# Patient Record
Sex: Male | Born: 1955 | Race: White | Hispanic: No | Marital: Single | Smoking: Former smoker
Health system: Southern US, Community
[De-identification: ages and names within clinical notes are randomized; demographics above are authoritative.]

## PROBLEM LIST (undated history)

## (undated) DIAGNOSIS — E119 Type 2 diabetes mellitus without complications: Secondary | ICD-10-CM

## (undated) DIAGNOSIS — I10 Essential (primary) hypertension: Secondary | ICD-10-CM

## (undated) DIAGNOSIS — I219 Acute myocardial infarction, unspecified: Secondary | ICD-10-CM

## (undated) HISTORY — PX: CORONARY STENT PLACEMENT: SHX1402

---

## 2006-10-10 ENCOUNTER — Inpatient Hospital Stay (HOSPITAL_COMMUNITY): Admission: EM | Admit: 2006-10-10 | Discharge: 2006-10-12 | Payer: Self-pay | Admitting: Internal Medicine

## 2006-10-10 ENCOUNTER — Ambulatory Visit: Payer: Self-pay | Admitting: Internal Medicine

## 2006-10-10 ENCOUNTER — Encounter: Payer: Self-pay | Admitting: Emergency Medicine

## 2006-10-28 ENCOUNTER — Ambulatory Visit: Payer: Self-pay | Admitting: Internal Medicine

## 2006-12-03 ENCOUNTER — Ambulatory Visit: Payer: Self-pay | Admitting: Internal Medicine

## 2008-07-21 ENCOUNTER — Emergency Department (HOSPITAL_COMMUNITY): Admission: EM | Admit: 2008-07-21 | Discharge: 2008-07-21 | Payer: Self-pay | Admitting: Emergency Medicine

## 2010-12-26 NOTE — Assessment & Plan Note (Signed)
[facility]                            CARDIOLOGY OFFICE NOTE   NAME:Herbert Compton, [name]                       MRN:          [id number]  DATE:12/06/2006                            DOB:          12-14-1955    IDENTIFICATION:  Patient is a gentleman who I follow in the clinic.  He  had an MI with bare metal stent to the RCA back in March.  When I saw  the patient on Friday, he still had a rash.  He had tried the Zyrtec,  did not tolerate it.  I told him to stop the simvastatin, to see if that  was causing a reaction.  He did that this weekend.  There was no change.   I told him to resume the simvastatin (he had been on Lipitor prior but  still with a rash).  He has a prescription to begin Ticlid b.i.d.  He  will be scheduled for bimonthly blood counts.  He will call at the end  of the week regarding his rash response.     Pricilla Riffle, MD, [facility]  Electronically Signed    PVR/MedQ  DD: 12/06/2006  DT: 12/07/2006  Job #: [id number]

## 2010-12-26 NOTE — Cardiovascular Report (Signed)
NAME:  CAMBRIDGE, DELEO NO.:  [id number]   MEDICAL RECORD NO.:  [id number]          PATIENT TYPE:  INP   LOCATION:  2920                         FACILITY:  [facility]   PHYSICIAN:  Everardo Beals. Juanda Chance, MD, FACCDATE OF BIRTH:  Dec 27, 1955   DATE OF PROCEDURE:  10/10/2006  DATE OF DISCHARGE:                            CARDIAC CATHETERIZATION   HISTORY:  Mr. Riches is 55 years old and works as a Psychologist, occupational.  He has no  prior history of known heart disease.  He is a former smoker.  At 6:30  a.m. he had developed chest pain and went to [facility] emergency room  where his ECG showed an acute diaphragmatic wall infarction and code  STEMI was called.  He was transferred by [facility] to the catheterization  laboratory.  He received heparin, aspirin and clopidogrel at [facility]  or en route.   PROCEDURE:  The procedure was performed of the femoral artery using an  arterial sheath and 6-French preformed coronary catheters.  A front wall  arterial puncture was performed and Omnipaque contrast was used.  After  completion of the diagnostic study, I made the decision to do  intervention on the totally occluded right coronary artery.   The patient given antiemetics bolus and infusion.  He had already been  given aspirin and 600 mg Plavix.  We used a 6-French JR-4 guiding  catheter with side holes and we crossed the totally obstructed artery  with a Prowater wire without difficulty.  We predilated with a 2.5 x 20-  mm Maverick balloon performing one inflation up to 8 atmospheres for 30  seconds.  This resulted in profound bradycardia requiring treatment with  Atropine, dopamine and IV fluids and repeated coughing.  We placed a  venous sheath but by the time we ready to pass a pacemaker, his blood  pressure and pulse rate had normalized.  We then stented the lesion with  a 3.5 x 12 mm Liberte stent, deploying this with one inflation of 16  atmospheres for 20 seconds.  We post dilated with a  4.0 x 8-mm Quantum  Maverick, performing two inflations at 16 atmospheres for 20 seconds.  Final diagnostics were then performed through the guiding catheter.  Right femoral artery was closed Angio-Seal anesthesia.  The patient  tolerated the procedure well and left the laboratory in satisfactory  condition.   RESULTS:  The aortic pressure was 115/76 with mean of 93 and left  ventricle pressure was 115/17.   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   The left anterior descending artery gave rise to a large diagonal branch  and two septal perforators.  There was 4% narrowing just in the proximal  LAD just after the first large diagonal branch.   The circumflex artery rose anomalously from the right coronary artery.  This gave rise to a marginal branch and two posterolateral branches  which appear to be free of significant disease.   The right coronary artery was a moderate-sized vessel which gave rise to  a  conus branch of right ventricular branch supplied part of the inferior  septum, a post descending branch and posterolateral branch.  The artery  was completely occluded at its mid portion before the right ventricular  branch.  There was a 40% narrowing proximally.   The left ventricle on RAO projection showed hypokinesis of the  inferobasal segment.  The rest of the wall motion was quite good.  The  ejection fraction was estimated at 60%.   Following stenting of the lesion in the right coronary artery,  the  stenosis improved from 100% to 0% and the flow improved from TIMI 0 to  TIMI 3 flow.   The patient had the onset of chest pain at 6:30, arrived at [facility]  emergency department 8:35.  Code STEMI was called at 8:57.  The patient  arrived at the [facility] cath lab via [facility] at 945.  First balloon  inflation was at 10:15.  This gave a door of balloon time for [facility]  of 100 minutes and refusion time  of 3 hours 45 minutes.   CONCLUSION:  1. 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  No significant obstruction in the circumflex artery      which arose anomalously from the right coronary artery and      inferobasal wall hypokinesis with an estimated fraction 60%.  2. Successful reperfusion and stenting of the lesion in the mid-right      coronary artery using a bare metal stent with improvement of the      narrowing from 100% to 0%,  improvement of flow from TIMI 0 to TIMI      3 flow.   DISPOSITION:  The patient returned to the coronary care unit for further  observation.  He will be classified as low risk and will target  discharge on day #2.      Bruce Elvera Lennox Juanda Chance, MD, [facility]  Electronically Signed     BRB/MEDQ  D:  10/10/2006  T:  10/10/2006  Job:  [id number]   cc:   Pricilla Riffle, MD, [facility]  Cardiopulmonary Lab

## 2010-12-26 NOTE — Assessment & Plan Note (Signed)
[facility]                       [place] CARDIOLOGY OFFICE NOTE   NAME:Herbert Compton, Herbert Compton                       MRN:          [id number]  DATE:10/28/2006                            DOB:          01/10/1956    IDENTIFICATION:  [name] is a 55 year old gentleman who was recently  discharged (March 4th) from [facility] after he suffered an inferior  wall myocardial infarction and underwent PTCA stent (bare metal) to the  RCA which it was 100% occluded. LVF was 60% with inferobasilar akinesis.   Since discharge, he has been doing well, although he says he feels  somewhat sluggish. He gradually seems to be getting some energy back. He  does note a rash, which he has noted in the hospital but denies any  pruritic symptoms. No shortness of breath or wheezing.   CURRENT MEDICATIONS:  1. Lipitor 80.  2. Aspirin 325.  3. Multivitamin daily.  4. Metoprolol XL 50 daily.  5. Plavix 75 daily.   PHYSICAL EXAMINATION:  The patient is in no acute distress. Blood  pressure is 130/80. Pulse is 72 and regular. Weight is 225 pounds.  HEENT: Normocephalic, atraumatic. EOMI. Throat clear.  NECK:  JVP is normal.  No bruits.  LUNGS:  Clear to auscultation. No wheezes or rales.  CARDIAC: Regular rate and rhythm, S1, S2. No S3. No  murmurs.  ABDOMEN: Benign.  EXTREMITIES: Good pulses. No edema. Right groin without any bruit.  SKIN: Mild erythema throughout. Question some maculopapular changes, but  very mild.   IMPRESSION:  1. Coronary artery disease, clinically doing well. I would like to      keep him on his beta-blocker therapy and I would not pull back for      now. Encouraged him to try to increase activity. Will switch him to      b.i.d. formulation for cough conservation. Will discuss with Charlies Constable how long he should be on the Plavix. He had been told a      year, but with the rash will need to follow. Would take Zyrtec as      this rash most  likely is related to Plavix.  2. Dyslipidemia. Switch him to Zocor 80 given cost constraints.  3. Rash as noted above.   Encouraged the patient to increase his activity. He is not sure, but he  will try if covered to go to cardiac rehab. I told him it is okay to  slowly increase his activities at work. I will set to see the patient  back in one months time.     Pricilla Riffle, MD, [facility]  Electronically Signed    PVR/MedQ  DD: 10/28/2006  DT: 10/28/2006  Job #: [id number]

## 2010-12-26 NOTE — H&P (Signed)
NAME:  SAYED, APOSTOL NO.:  [id number]   MEDICAL RECORD NO.:  [id number]          PATIENT TYPE:  INP   LOCATION:  2920                         FACILITY:  [facility]   PHYSICIAN:  Pricilla Riffle, MD, FACCDATE OF BIRTH:  11/21/1955   DATE OF ADMISSION:  10/10/2006  DATE OF DISCHARGE:                              HISTORY & PHYSICAL   INDICATIONS:  Mr. Crescenzo is a 55 year old male with no prior history of  coronary artery disease.  He got over the flu a couple of weeks ago and  has been short of breath since.  He said it gets worse and also  developed some chest pressure with exertion.   This morning he woke up and again noticed some tightness in his chest  and presented to the emergency room for further evaluation.  Patient  noted the chest pain on arrival was 2/10.  He was slightly diaphoretic  and clammy, nauseated.   EKG was done that showed ST elevation inferiorly.  Patient was given  nitroglycerin x3, aspirin, heparin, Lopressor and transfer to [facility].  Note:  Plavix 600 given.   ALLERGIES:  NEOSPORIN.   PAST MEDICAL HISTORY:  1. DJD, back.  2. Jehovah's Witness.   SOCIAL HISTORY:  The patient does not smoke, does not drink.   FAMILY HISTORY:  Father has a history of coronary artery disease, and is  deceased, also history of CVA.  Mother is alive at age 29.   REVIEW OF SYSTEMS:  All systems are reviewed.  Negative to the above  problem except as noted.   PHYSICAL EXAM:  GENERAL:  On exam the patient is in no distress, denies  pain actually.  VITAL SIGNS:  Blood pressure 150 systolic.  Pulse is 65.  Respiratory  rate 16.  HEENT:  Normocephalic, atraumatic.  EOMI, PERRL.  Throat clear.  NECK:  JVP is normal.  LUNGS:  Clear to auscultation.  No rales.  No wheezes.  CARDIAC EXAM:  Regular rate and rhythm.  Very distant heart sounds.  No  extra heart sounds noted.  Normal S1, S2.  No significant murmurs.  ABDOMEN:  Is benign.  Obese.  EXTREMITIES:  2+ dorsalis pedis pulses.  No lower extremity edema.   LABS:  Show a hemoglobin of 14.9.   12-lead EKG in sinus rhythm at 64 beats/minute, inferior ST elevation  with slight ST depression V2, T-wave inversion V4-V6.   IMPRESSION:  Patient is a 55 year old gentleman, no known history of  coronary artery disease, does not follow up with a doctor much, his  father who had coronary disease, now with changes consistent with  inferior wall myocardial infarction.  He is currently pain-free.   PLAN:  Plan for cardiac catheterization.  Risks and benefits described.  Patient understands and agrees to proceed.      Pricilla Riffle, MD, [facility]  Electronically Signed     PVR/MEDQ  D:  10/10/2006  T:  10/10/2006  Job:  [id number]

## 2010-12-26 NOTE — Discharge Summary (Signed)
NAME:  Herbert Compton, Herbert Compton NO.:  [id number]   MEDICAL RECORD NO.:  [id number]          PATIENT TYPE:  INP   LOCATION:  4742                         FACILITY:  [facility]   PHYSICIAN:  Everardo Beals. Juanda Chance, MD, FACCDATE OF BIRTH:  1956/07/27   DATE OF ADMISSION:  10/10/2006  DATE OF DISCHARGE:  10/12/2006                               DISCHARGE SUMMARY   PRIMARY CARDIOLOGIST:  Pricilla Riffle, MD, [facility].   PRINCIPAL DIAGNOSIS:  Acute inferior ST-segment elevation myocardial  infarction.   SECONDARY DIAGNOSES:  1. Coronary artery disease.  2. Hyperlipidemia.  3. 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  4. Family history of coronary artery disease.  5. Denied accepting blood products.   ALLERGIES:  NEOSPORIN.   PROCEDURES:  1. Left heart cardiac catheterization with successful PCI and stenting      of the distal right coronary artery with placement of a 3.5 x 12 mm      Liberte bare metal stent.   HISTORY OF PRESENT ILLNESS:  A 55 year old male with no prior history of  coronary artery disease who was in his usual state of health until  approximately two weeks prior to admission when he began to experience  exertional chest discomfort with dyspnea.  On the morning of admission,  he awoke at 6:30 a.m. with chest pain and presented to [facility] where ECG revealed ST segment elevation in the inferior leads.  The patient was transferred to [facility] for further management of  acute inferior ST elevation MI.   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  The RCA was successfully stented with a 3.5 x 12 mm  Liberte bare metal stent.  Notably, an EF of 60% with inferobasilar  akinesis.   The patient was followed in the  ICU for 24 hours and was transferred to  the floor on March 3.  He has had no recurrent chest discomfort and has  been ambulating without difficulty.  He is being discharged home today  in satisfactory condition.   DISCHARGE LABORATORY DATA:  Hemoglobin 12.9, hematocrit 37.1, WBC 9.7,  platelets 255, MCV 94.2, sodium 138, potassium 4.2, chloride 104, CO2  27, BUN 10, creatinine 0.95, glucose 99.  PT 12.5, INR is 0.9.  Peak CK  was 538, MB of 101.3, and troponin-I of 18.75.  Total cholesterol 198,  triglycerides 422, HDL 28, LDL not calculated, calcium 9.0, hemoglobin  A1c 6.5.   DISPOSITION:  The patient is being discharged home  today in good  condition.   FOLLOWUP PLANS AND APPOINTMENTS:  The patient has followup with Dr.  Dietrich Pates on October 28, 2006 at 2:30 p.m.  He is also to follow up with  primary care Ahnna Dungan in approximately one to two weeks.   DISCHARGE MEDICATIONS:  1. Aspirin 325 mg daily.  2. Plavix 75 mg daily.  3. Lipitor 80 mg q.p.m.  4. __________ 50 mg daily.  5. Nitroglycerin 0.4 mg sublingual p.r.n. chest pain.   The patient has been counseled for weight loss and a low glycemic diet  as he does have an elevated hemoglobin A1c.  It is recommended he follow  up with his primary care Janele Lague with regards to impaired glucose  tolerance.   OUTSTANDING LABORATORY STUDIES:  None.   DURATION OF DISCHARGE ENCOUNTER:  Thirty-five minutes including  physician time.      Nicolasa Ducking, ANP      Bruce R. Juanda Chance, MD, [facility]  Electronically Signed    CB/MEDQ  D:  10/12/2006  T:  10/12/2006  Job:  [id number]   cc:   Pricilla Riffle, MD, [facility]

## 2010-12-26 NOTE — Assessment & Plan Note (Signed)
[facility]                       [place] CARDIOLOGY OFFICE NOTE   NAME:Herbert Compton, Herbert Compton                       MRN:          [id number]  DATE:12/03/2006                            DOB:          Dec 07, 1955    IDENTIFICATION:  Mr. Friesen is a 55 year old gentleman, discharged from  [facility] back in early March, after suffering an inferior wall MI.  Underwent bare metal stent placement to the RCA that was occluded prior.  I last saw him on March 20.   When I saw him last, he had a rash.  I recommended taking Zyrtec.  Felt  the rash most likely was from the Plavix.   The patient currently denies chest pain, no shortness of breath.  He has  felt some sluggish, though, since being on the medicines.  Rash persists  again and he did not tolerate Zyrtec, said he felt bad.   CURRENT MEDICATIONS INCLUDE:  1. Aspirin 325 daily.  2. Multivitamin.  3. Plavix 75 daily.  4. Simvastatin 80 daily.   PHYSICAL EXAM:  Patient is in no distress.  Blood pressure 112/80, pulse  of 68 and regular, weight 223.  LUNGS:  Clear.  CARDIAC EXAM:  Regular rate and rhythm, S1, S2, no S3, no murmurs.  ABDOMEN:  Benign.  EXTREMITIES:  No edema.  SKIN:  Mild maculopapular diffuse rash.   IMPRESSION:  1. CAD.  Doing well, except for the rash.  I have discussed the case      with Charlies Constable.  He would like to see 6-12 months of dual      antiplatelet therapy and recommends switching the patient to      Ticlid.  Prior to doing this, I will try discontinuation of Zocor      and, if his rash improves, keep on the Plavix.  Otherwise, resume      Zocor and discontinue Plavix, switching over to Ticlid.  He will      need to have blood counts drawn every two weeks and he is aware of      this.  2. Dyslipidemia.  On Zocor for now with short trial discontinuation.  3. Rash, as above.  4. Fatigability.  We will back down on his Lopressor a bit.  Again, I      do not think the  rash is from this.  Back down to 12.5 b.i.d. to      see if his energy level improves.   I will set followup for August, sooner if problems develop.   ADDENDUM:  Patient can cut back on aspirin to 81 mg daily.     Pricilla Riffle, MD, [facility]  Electronically Signed    PVR/MedQ  DD: 12/03/2006  DT: 12/03/2006  Job #: [id number]

## 2011-05-15 LAB — URINALYSIS, ROUTINE W REFLEX MICROSCOPIC
Glucose, UA: NEGATIVE mg/dL
Ketones, ur: NEGATIVE mg/dL
Urobilinogen, UA: 0.2 mg/dL (ref 0.0–1.0)
pH: 7.5 (ref 5.0–8.0)

## 2012-03-24 ENCOUNTER — Ambulatory Visit (HOSPITAL_COMMUNITY)
Admission: RE | Admit: 2012-03-24 | Discharge: 2012-03-24 | Disposition: A | Discharge status: Home / Self Care | Payer: Non-veteran care | Source: Ambulatory Visit | Attending: Pathology | Admitting: Pathology

## 2012-03-24 DIAGNOSIS — M79609 Pain in unspecified limb: Secondary | ICD-10-CM | POA: Insufficient documentation

## 2012-03-24 DIAGNOSIS — M545 Low back pain, unspecified: Secondary | ICD-10-CM | POA: Insufficient documentation

## 2012-03-24 DIAGNOSIS — IMO0001 Reserved for inherently not codable concepts without codable children: Secondary | ICD-10-CM | POA: Insufficient documentation

## 2012-03-24 NOTE — Evaluation (Signed)
Physical Therapy Evaluation  Patient Details  Name: Herbert Compton MRN: [id number] Date of Birth: 1956/01/22  Today's Date: 03/24/2012 Time: 1435-1520 PT Time Calculation (min): 45 min  Visit#: 1  of 8   Re-eval: 04/23/12 Assessment Diagnosis:  (LBP) Prior Therapy: N/A  Authorization: [facility]    Authorization Time Period: 02/23/2012-05/09/2012  Authorization Visit#: 1  of 8    Past Medical History: No past medical history on file. Past Surgical History: No past surgical history on file.  Subjective Symptoms/Limitations Symptoms: Pt states that his pain has been primarily on his right side until about six months ago when it began to go down his left leg to his knee level.  The patient states that he has pain going down his leg multiple times a day.   How long can you sit comfortably?: The patient states that he is able to sit for fifteen to thirty minutes before he has to get up. How long can you stand comfortably?: The patient is able to stand easier than he can sit.  Able to stand for 20-30 minutes. How long can you walk comfortably?: The patient states that as long as he is walking slow he could walk for thirty minutes straight. Special Tests: The patient states that he is waking up at least once a night. Pain Assessment Currently in Pain?: Yes Pain Score:   2 (worst pain in the past week 10/10; best 2/10) Pain Location: Back Pain Orientation: Left;Right Pain Type: Chronic pain Pain Onset: More than a month ago Pain Frequency: Constant (but pain varies.) Pain Relieving Factors: ICE/ TENS  Effect of Pain on Daily Activities: better in the morning  worse as the day goes on.   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) Leisure: Hobbies-yes (Comment) Comments: fish    Sensation/Coordination/Flexibility/Functional Tests Functional  Tests Functional Tests: PSLR R55; L 40  Assessment RLE Strength Right Hip Flexion: 5/5 Right Hip Extension: 4/5 Right Hip ABduction: 5/5 Right Knee Flexion: 4/5 Right Knee Extension: 5/5 Right Ankle Dorsiflexion: 5/5 LLE Strength Left Hip Flexion: 5/5 Left Hip Extension: 4/5 Left Hip ABduction: 5/5 Left Knee Flexion: 4/5 Left Knee Extension: 5/5 Left Ankle Dorsiflexion: 5/5 Lumbar AROM Lumbar Flexion: decreased 25% pain upon return Lumbar Extension: wnl- reps no change Lumbar - Right Side Bend: wnl-reps causing no change. Lumbar - Left Side Bend: wnl-reps no change Lumbar - Right Rotation: decreased 15% Lumbar - Left Rotation: wnl  Exercise/Treatments Mobility/Balance  Posture/Postural Control Posture/Postural Control: Postural limitations Postural Limitations: protruding abs;    Stretches Active Hamstring Stretch: 3 reps;30 seconds Single Knee to Chest Stretch: 3 reps;30 seconds   Supine Ab Set: 5 reps Other Supine Lumbar Exercises: Kegal 5     Physical Therapy Assessment and Plan PT Assessment and Plan Clinical Impression Statement: pt with tight hamstring/piriformis mm and weak core who will benefit from skilled PT to decrease back pain. Pt will benefit from skilled therapeutic intervention in order to improve on the following deficits: Pain Rehab Potential: Good PT Frequency: Min 2X/week PT Duration: 4 weeks PT Treatment/Interventions: Therapeutic exercise PT Plan: work on core stability exercisesl    Goals Home Exercise Program Pt will Perform Home Exercise Program: Independently PT Short Term Goals Time to Complete Short Term Goals: 2 weeks PT Short Term Goal 1: Pt to state pain is not over a 7/10 PT Short Term Goal 2: Pt  able to sit for 45 min with comfort. PT Short Term Goal 3: Pt able to walk for 45 minutes with comfort PT Long Term Goals Time to Complete Long Term Goals: 4 weeks PT Long Term Goal 1: Pt to state no more radicular pain PT Long  Term Goal 2: Pt to state pain is no greater than a 5/10  80% of the day Long Term Goal 3: Pt to be able to walk for an hour without pain Long Term Goal 4: Pt to be able to sit for an hour without pain.  Problem List There is no problem list on file for this patient.   PT - End of Session Activity Tolerance: Patient tolerated treatment well General Behavior During Session: [facility] for tasks performed Cognition: [facility] for tasks performed PT Plan of Care PT Home Exercise Plan: given Consulted and Agree with Plan of Care: Patient     Bricyn Labrada,CINDY 03/24/2012, 3:27 PM  Physician Documentation Your signature is required to indicate approval of the treatment plan as stated above.  Please sign and either send electronically or make a copy of this report for your files and return this physician signed original.   Please mark one 1.__approve of plan  2. ___approve of plan with the following conditions.   ______________________________                                                          _____________________ Physician Signature                                                                                                             Date

## 2012-03-29 ENCOUNTER — Ambulatory Visit (HOSPITAL_COMMUNITY)
Admission: RE | Admit: 2012-03-29 | Discharge: 2012-03-29 | Disposition: A | Discharge status: Home / Self Care | Payer: Non-veteran care | Source: Ambulatory Visit | Attending: Pathology | Admitting: Pathology

## 2012-03-29 NOTE — Progress Notes (Signed)
Physical Therapy Treatment Patient Details  Name: Herbert Compton MRN: [id number] Date of Birth: [birth year]  Today's Date: 03/29/2012 Time: 6962-9528 PT Time Calculation (min): 45 min  Visit#: 2  of 8   Re-eval: 04/23/12 Charges: Therex x 32' Manual x 8'   Authorization: [facility]    Authorization Time Period: 02/23/2012-05/09/2012  Authorization Visit#: 2  of 8    Subjective: Symptoms/Limitations Symptoms: Pt reprots HEP compliance. Pain Assessment Currently in Pain?: Yes Pain Score:   2 Pain Location: Back Pain Orientation: Lower Pain Radiating Towards: R lateral knee (4/10) burning sensation   Exercise/Treatments Stretches Active Hamstring Stretch: 3 reps;30 seconds Single Knee to Chest Stretch: 3 reps;30 seconds Supine Ab Set: 10 reps;5 seconds Bent Knee Raise: 10 reps Bridge: 10 reps;3 seconds Other Supine Lumbar Exercises: Pelvic floor contraction 10x5" Prone  Straight Leg Raise: 10 reps Other Prone Lumbar Exercises: POE x 4'  Manual Therapy Manual Therapy: Other (comment) Other Manual Therapy: R sciatic nerve glides to decrese radicular sx  Physical Therapy Assessment and Plan PT Assessment and Plan Clinical Impression Statement: Pt completes stabilization exercises with good core control after demo and cueing for technique. Pt appears to find relief from radicular sx with trunk ext. Began sciatic nerve glides on R LE. Pt reports radicular sx decrease from 4/10 to 1/10. No change in LBP (2/10). PT Plan: Continue to progress per PT POC.     Problem List There is no problem list on file for this patient.   PT - End of Session Activity Tolerance: Patient tolerated treatment well General Behavior During Session: [facility] for tasks performed Cognition: [facility] for tasks performed  Seth Bake, PTA 03/29/2012, 4:05 PM

## 2012-03-31 ENCOUNTER — Ambulatory Visit (HOSPITAL_COMMUNITY)
Admission: RE | Admit: 2012-03-31 | Discharge: 2012-03-31 | Disposition: A | Discharge status: Home / Self Care | Payer: Non-veteran care | Source: Ambulatory Visit | Attending: Pathology | Admitting: Pathology

## 2012-03-31 NOTE — Progress Notes (Signed)
Physical Therapy Treatment Patient Details  Name: Herbert Compton MRN: [id number] Date of Birth: 1956/06/28  Today's Date: 03/31/2012 Time: 1418-1500 PT Time Calculation (min): 42 min  Visit#: 3  of 8   Re-eval: 04/23/12 Charges: Therex x 30' Manual x 8'  Authorization: [facility]    Authorization Time Period: 02/23/2012-05/09/2012  Authorization Visit#: 3  of 8    Subjective: Symptoms/Limitations Symptoms: Pt reports continues radicular sx in R leg. L LE had some sx that subsided earlier in the day. Pain Assessment Currently in Pain?: Yes Pain Score:   2 Pain Location: Back Pain Orientation: Right;Lower Pain Radiating Towards: R lateral ankle   Exercise/Treatments Supine Ab Set: 10 reps;5 seconds Bent Knee Raise: 10 reps Bridge: 15 reps Other Supine Lumbar Exercises: Pelvic floor contraction 10x5" Prone  Single Arm Raise: 10 reps Straight Leg Raise: 10 reps Other Prone Lumbar Exercises: POE x 4'  Manual Therapy Manual Therapy: Other (comment) Other Manual Therapy:  R sciatic nerve glides to decrese radicular sx  Physical Therapy Assessment and Plan PT Assessment and Plan Clinical Impression Statement: Pt continues to do well with stabilization exercises. Pt also continues to find relief from radicular sx with trunk extension and nerve glides. Pt reports radicular sx decrease to 1/10 at end of session. LBP increase to 3/10 secondary to centralization of pain. PT Plan: Continue to progress per PT POC.     Problem List There is no problem list on file for this patient.   PT - End of Session Activity Tolerance: Patient tolerated treatment well General Behavior During Session: [facility] for tasks performed Cognition: [facility] for tasks performed  Seth Bake, PTA 03/31/2012, 3:04 PM

## 2012-04-05 ENCOUNTER — Ambulatory Visit (HOSPITAL_COMMUNITY)
Admission: RE | Admit: 2012-04-05 | Discharge: 2012-04-05 | Disposition: A | Discharge status: Home / Self Care | Payer: Non-veteran care | Source: Ambulatory Visit | Attending: Pathology | Admitting: Pathology

## 2012-04-05 NOTE — Progress Notes (Addendum)
Physical Therapy Treatment Patient Details  Name: Herbert Compton MRN: [id number] Date of Birth: 03/20/1956  Today's Date: 04/05/2012 Time: 7829-5621 PT Time Calculation (min): 42 min  Visit#: 4  of 8   Re-eval: 04/23/12 Charges: Therex x 30' Manual x 8'  Authorization: [facility]    Authorization Time Period: 02/23/2012-05/09/2012  Authorization Visit#: 4  of 8    Subjective: Symptoms/Limitations Symptoms: Pt states that radicualr sx have decreased.  Pain Assessment Currently in Pain?: Yes Pain Score:   2 Pain Location: Back Pain Orientation: Right;Lower Pain Radiating Towards: R lateral ankle   Exercise/Treatments Supine Ab Set: 10 reps;5 seconds (HEP) Bent Knee Raise: 10 reps Bridge: 15 reps Large Ball Abdominal Isometric: 5 reps;5 seconds Large Ball Oblique Isometric: 5 reps;5 seconds Other Supine Lumbar Exercises: Pelvic floor contraction 10x5" (HEP) Prone  Single Arm Raise: 10 reps Straight Leg Raise: 10 reps Other Prone Lumbar Exercises: POE x 3'  Manual Therapy Manual Therapy: Other (comment) Other Manual Therapy: R sciatic nerve glides to decrese radicular sx   Physical Therapy Assessment and Plan PT Assessment and Plan Clinical Impression Statement: Progressed stabilization exercises with minimal difficulty. Nerve glides and trunk extension continue to reduce radicular sx. Pt reports continued HEP compliance. At end of session pt reports radicular pain decrease to 1/10.  PT Plan: Continue to progress per PT POC. Begin scapular tband exercises next session.     Problem List There is no problem list on file for this patient.   PT - End of Session Activity Tolerance: Patient tolerated treatment well General Behavior During Session: [facility] for tasks performed Cognition: [facility] for tasks performed   Seth Bake, PTA 04/05/2012, 3:55 PM

## 2012-04-07 ENCOUNTER — Ambulatory Visit (HOSPITAL_COMMUNITY)
Admission: RE | Admit: 2012-04-07 | Discharge: 2012-04-07 | Disposition: A | Discharge status: Home / Self Care | Payer: Non-veteran care | Source: Ambulatory Visit | Attending: Pathology | Admitting: Pathology

## 2012-04-07 NOTE — Progress Notes (Signed)
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  Pain Assessment Currently in Pain?: Yes Pain Score:   4 Pain Location: Back Pain Orientation: Right;Lower Pain Radiating Towards: R lateral ankle (radicular sx are 2/10)   Exercise/Treatments Standing Scapular Retraction: 10 reps;Theraband Theraband Level (Scapular Retraction): Level 3 (Green) Row: 10 reps;Theraband Theraband Level (Row): Level 3 (Green) Shoulder Extension: 20 reps;Theraband Theraband Level (Shoulder Extension): Level 3 (Green) Supine Dead Bug: 5 reps Bridge: 20 reps;5 seconds Large Ball Abdominal Isometric: 10 reps;5 seconds Large Ball Oblique Isometric: 5 reps;5 seconds Prone  Single Arm Raise: 10 reps Straight Leg Raise: 10 reps Opposite Arm/Leg Raise: 5 reps;Right arm/Left leg;Left arm/Right leg Other Prone Lumbar Exercises: heels squeeze 10x5"  Physical Therapy Assessment and Plan PT Assessment and Plan Clinical Impression Statement: Began scapular tband exercises to improve postural muscles with multimodal cueing for proper technique. Progressed bent knee raise to dead bug with multimodal cueing for abdominal control. Radicular sx appear to be gradually decreasing. Pt reports decrease in LBP from 4/10 to 3/10 at end of session. PT Plan: Continue to progress per PT POC.      Problem List There is no problem list on file for this patient.   PT - End of Session Activity Tolerance: Patient tolerated treatment well General Behavior During Session: [facility] for tasks performed Cognition: [facility]  for tasks performed  Seth Bake, PTA 04/07/2012, 3:38 PM

## 2012-04-12 ENCOUNTER — Ambulatory Visit (HOSPITAL_COMMUNITY)
Admission: RE | Admit: 2012-04-12 | Discharge: 2012-04-12 | Disposition: A | Discharge status: Home / Self Care | Payer: Non-veteran care | Source: Ambulatory Visit | Attending: Pathology | Admitting: Pathology

## 2012-04-12 DIAGNOSIS — IMO0001 Reserved for inherently not codable concepts without codable children: Secondary | ICD-10-CM | POA: Insufficient documentation

## 2012-04-12 DIAGNOSIS — M79609 Pain in unspecified limb: Secondary | ICD-10-CM | POA: Insufficient documentation

## 2012-04-12 DIAGNOSIS — M545 Low back pain, unspecified: Secondary | ICD-10-CM | POA: Insufficient documentation

## 2012-04-12 NOTE — Progress Notes (Signed)
Physical Therapy Treatment Patient Details  Name: DATRELL DUNTON MRN: [id number] Date of Birth: September 06, 1955  Today's Date: 04/12/2012 Time: 4098-1191 PT Time Calculation (min): 52 min  Visit#: 6  of 8   Re-eval: 04/23/12 Charges: Therex x 25' Traction x 17'   Authorization: [facility]    Authorization Time Period: 02/23/2012-05/09/2012  Authorization Visit#: 6  of 8    Subjective: Symptoms/Limitations Symptoms: Pt states that his pain is intensified today. He does not know why. Pain Assessment Currently in Pain?: Yes Pain Score:   4 Pain Location: Back Pain Orientation: Lower Pain Radiating Towards: R lateral ankle (radicular sx are 2/10); Pain in LLE is sporadic but goes up to an 8/10   Exercise/Treatments  04/12/12 1400  Lumbar Exercises: Standing  Scapular Retraction 10 reps;Theraband  Theraband Level (Scapular Retraction) Level 3 (Green)  Row 10 reps;Theraband  Theraband Level (Row) Level 3 (Green)  Shoulder Extension 20 reps;Theraband  Theraband Level (Shoulder Extension) Level 3 (Green)  Lumbar Exercises: Supine  Large Ball Abdominal Isometric 10 reps;5 seconds  Large Ball Oblique Isometric 10 reps;5 seconds   Modalities Modalities: Traction Traction Type of Traction: Lumbar Min (lbs): 75# Max (lbs): 90# Hold Time: 45" Rest Time: 15" Time: 69' (17')  Physical Therapy Assessment and Plan PT Assessment and Plan Clinical Impression Statement: Pt completes therex with decreased need for cueing. Began mechanical lumbar traction as radicular sx are increased this session, particularly in LLE. Pt reprots0/10 radicular sx and slight increase in back pain at end of session. PT Plan: Continue to progress per PT POC.      Problem List There is no problem list on file for this patient.   PT - End of Session Activity Tolerance: Patient tolerated treatment well General Behavior During Session: [facility] for tasks performed Cognition: [facility] for tasks performed   Seth Bake,  PTA 04/12/2012, 6:14 PM

## 2012-04-14 ENCOUNTER — Ambulatory Visit (HOSPITAL_COMMUNITY)
Admission: RE | Admit: 2012-04-14 | Discharge: 2012-04-14 | Disposition: A | Discharge status: Home / Self Care | Payer: Non-veteran care | Source: Ambulatory Visit | Attending: Pathology | Admitting: Pathology

## 2012-04-14 NOTE — Progress Notes (Signed)
Physical Therapy Treatment Patient Details  Name: Herbert Compton MRN: [id number] Date of Birth: 11/20/1955  Today's Date: 04/14/2012 Time: 1191-4782 PT Time Calculation (min): 51 min  Visit#: 7  of 8   Re-eval: 04/23/12 Charges: Therex 23' Traction x 17'  Authorization: [facility]    Authorization Time Period: 02/23/2012-05/09/2012  Authorization Visit#: 7  of 8    Subjective: Symptoms/Limitations Symptoms: Pt states that his back pain increased after last session but his radicular sx have decreased. Pain Assessment Pain Score:   2 Pain Location: Back Pain Orientation: Right;Lower Pain Radiating Towards: R lateral ankle   Exercise/Treatments Standing Scapular Retraction: 15 reps Theraband Level (Scapular Retraction): Level 3 (Green) Row: 15 reps Theraband Level (Row): Level 3 (Green) Shoulder Extension: 15 reps Theraband Level (Shoulder Extension): Level 3 (Green) Supine Large Ball Abdominal Isometric: 10 reps;5 seconds Large Ball Oblique Isometric: 10 reps;5 seconds  Modalities Modalities: Traction Traction Type of Traction: Lumbar Min (lbs): 65 Max (lbs): 80 Hold Time: static Time: 65' (17')  Physical Therapy Assessment and Plan PT Assessment and Plan Clinical Impression Statement: Traction completed again this session but parameters were changes to static and the wt was lowered secondary to increased back pain after last session. Pt completes therex well and requires minimal cueing for core control. Pt reports no radicular sx at end of session but increased back pain. Pt states back pain is not as intense as last session. PT Plan: Reassess next session.     Problem List There is no problem list on file for this patient.   PT - End of Session Activity Tolerance: Patient tolerated treatment well General Behavior During Session: [facility] for tasks performed Cognition: [facility] for tasks performed  Seth Bake, PTA 04/14/2012, 3:53 PM

## 2012-04-19 ENCOUNTER — Ambulatory Visit (HOSPITAL_COMMUNITY)
Admission: RE | Admit: 2012-04-19 | Discharge: 2012-04-19 | Disposition: A | Discharge status: Home / Self Care | Payer: Non-veteran care | Source: Ambulatory Visit | Attending: Pathology | Admitting: Pathology

## 2012-04-19 NOTE — Evaluation (Signed)
Physical Therapy Evaluation  Patient Details  Name: Herbert Compton MRN: [id number] Date of Birth: Mar 01, 1956  Today's Date: 04/19/2012 Time: 4098-1191 PT Time Calculation (min): 55 min  Visit#: 8  of 8   Re-eval: 04/23/12 Charges: MMT x 1 ROMM x 1 Therex x 5' Self care x 15' Traction x 17'  Authorization: [facility]    Authorization Time Period: 02/23/2012-05/09/2012  Authorization Visit#: 8  of 8    Past Medical History: No past medical history on file. Past Surgical History: No past surgical history on file.  Subjective Symptoms/Limitations Symptoms: I feel ggod to day. The traction seemed to help last session. Pain Assessment Currently in Pain?: Yes Pain Score:   3 Pain Location: Back Pain Orientation: Lower Pain Radiating Towards: dull ache in R leg   Assessment RLE Strength Right Hip Flexion: 5/5 Right Hip Extension: 5/5 (was 4/5) Right Hip ABduction: 5/5 Right Knee Flexion: 5/5 (was 4/5) Right Knee Extension: 5/5 Right Ankle Dorsiflexion: 5/5 LLE Strength Left Hip Flexion: 5/5 Left Hip Extension: 5/5 (was 4/5) Left Hip ABduction: 5/5 Left Knee Flexion: 5/5 (4/5) Left Knee Extension: 5/5 Left Ankle Dorsiflexion: 5/5 Lumbar AROM Lumbar Flexion: decreased 10% no pain Lumbar Extension: wnl Lumbar - Right Side Bend: wnl Lumbar - Left Side Bend: wnl Lumbar - Right Rotation: wnl Lumbar - Left Rotation: wnl  Exercise/Treatments  Standing Scapular Retraction: 10 reps Theraband Level (Scapular Retraction): Level 3 (Green) Row: 10 reps Theraband Level (Row): Level 3 (Green) Shoulder Extension: 10 reps Theraband Level (Shoulder Extension): Level 3 (Green)  Modalities Modalities: Traction Traction Type of Traction: Lumbar Min (lbs): 65 Max (lbs): 80 Hold Time: static Time: 15' (17')  Physical Therapy Assessment and Plan PT Assessment and Plan Clinical Impression Statement: Pt has progressed well with therapy. Pt presents with improved strength and ROM.  Radicular sx appear to be decreasing. Pt would like to D/C to HEP to continue improving strength/flexibility and decreasing pain. HEP given. PT Plan: Recommend D/C to HEP.    Goals Home Exercise Program Pt will Perform Home Exercise Program: Independently PT Short Term Goals Time to Complete Short Term Goals: 2 weeks PT Short Term Goal 1: Pt to state pain is not over a 7/10 PT Short Term Goal 1 - Progress: Partly met (Pt states that he has sporadic radicular pain through LLE) PT Short Term Goal 2: Pt able to sit for 45 min with comfort. PT Short Term Goal 2 - Progress: Met PT Short Term Goal 3: Pt able to walk for 45 minutes with comfort PT Short Term Goal 3 - Progress: Met PT Long Term Goals Time to Complete Long Term Goals: 4 weeks PT Long Term Goal 1: Pt to state no more radicular pain PT Long Term Goal 1 - Progress: Progressing toward goal PT Long Term Goal 2: Pt to state pain is no greater than a 5/10  80% of the day PT Long Term Goal 2 - Progress: Met Long Term Goal 3: Pt to be able to walk for an hour without pain Long Term Goal 3 Progress: Progressing toward goal Long Term Goal 4: Pt to be able to sit for an hour without pain. Long Term Goal 4 Progress: Met  Problem List There is no problem list on file for this patient.   PT - End of Session Activity Tolerance: Patient tolerated treatment well General Behavior During Session: [facility] for tasks performed Cognition: [facility] for tasks performed PT Plan of Care PT Home Exercise Plan: HEP given for  scapular tband ex, prone opp arm/leg raise and dead bug.  Seth Bake, PTA 04/19/2012, 3:16 PM  Physician Documentation Your signature is required to indicate approval of the treatment plan as stated above.  Please sign and either send electronically or make a copy of this report for your files and return this physician signed original.   Please mark one 1.__approve of plan  2. ___approve of plan with the following  conditions.   ______________________________                                                          _____________________ Physician Signature                                                                                                             Date

## 2012-04-21 ENCOUNTER — Ambulatory Visit (HOSPITAL_COMMUNITY): Payer: Self-pay | Admitting: Physical Therapy

## 2015-10-28 ENCOUNTER — Observation Stay (HOSPITAL_COMMUNITY)
Admission: EM | Admit: 2015-10-28 | Discharge: 2015-10-29 | Disposition: A | Discharge status: Home / Self Care | Payer: Non-veteran care | Attending: Internal Medicine | Admitting: Internal Medicine

## 2015-10-28 ENCOUNTER — Encounter (HOSPITAL_COMMUNITY): Payer: Self-pay | Admitting: Emergency Medicine

## 2015-10-28 ENCOUNTER — Emergency Department (HOSPITAL_COMMUNITY): Payer: Non-veteran care

## 2015-10-28 DIAGNOSIS — I2510 Atherosclerotic heart disease of native coronary artery without angina pectoris: Secondary | ICD-10-CM | POA: Diagnosis not present

## 2015-10-28 DIAGNOSIS — Z79899 Other long term (current) drug therapy: Secondary | ICD-10-CM | POA: Insufficient documentation

## 2015-10-28 DIAGNOSIS — I10 Essential (primary) hypertension: Secondary | ICD-10-CM | POA: Diagnosis not present

## 2015-10-28 DIAGNOSIS — Z87891 Personal history of nicotine dependence: Secondary | ICD-10-CM | POA: Diagnosis not present

## 2015-10-28 DIAGNOSIS — R079 Chest pain, unspecified: Secondary | ICD-10-CM | POA: Diagnosis present

## 2015-10-28 DIAGNOSIS — M25512 Pain in left shoulder: Secondary | ICD-10-CM | POA: Insufficient documentation

## 2015-10-28 DIAGNOSIS — I252 Old myocardial infarction: Secondary | ICD-10-CM | POA: Diagnosis not present

## 2015-10-28 DIAGNOSIS — E785 Hyperlipidemia, unspecified: Secondary | ICD-10-CM

## 2015-10-28 DIAGNOSIS — R0789 Other chest pain: Secondary | ICD-10-CM | POA: Diagnosis not present

## 2015-10-28 HISTORY — DX: Acute myocardial infarction, unspecified: I21.9

## 2015-10-28 HISTORY — DX: Essential (primary) hypertension: I10

## 2015-10-28 LAB — BASIC METABOLIC PANEL
ANION GAP: 8 (ref 5–15)
BUN: 14 mg/dL (ref 6–20)
CO2: 23 mmol/L (ref 22–32)
Calcium: 9 mg/dL (ref 8.9–10.3)
Chloride: 108 mmol/L (ref 101–111)
Creatinine, Ser: 0.82 mg/dL (ref 0.61–1.24)
GFR calc Af Amer: >60 mL/min (ref >60)
GLUCOSE: 104 mg/dL — AB (ref 65–99)
POTASSIUM: 4.3 mmol/L (ref 3.5–5.1)
Sodium: 139 mmol/L (ref 135–145)

## 2015-10-28 LAB — CBC
HEMATOCRIT: 42.8 % (ref 39.0–52.0)
HEMOGLOBIN: 14.7 g/dL (ref 13.0–17.0)
MCH: 34.4 pg — ABNORMAL HIGH (ref 26.0–34.0)
MCHC: 34.3 g/dL (ref 30.0–36.0)
MCV: 100.2 fL — ABNORMAL HIGH (ref 78.0–100.0)
Platelets: 146 10*3/uL — ABNORMAL LOW (ref 150–400)
RBC: 4.27 MIL/uL (ref 4.22–5.81)
RDW: 12.9 % (ref 11.5–15.5)
WBC: 6.4 10*3/uL (ref 4.0–10.5)

## 2015-10-28 LAB — TROPONIN I
Troponin I: <0.03 ng/mL (ref <0.031)
Troponin I: <0.03 ng/mL (ref <0.031)

## 2015-10-28 MED ORDER — MORPHINE SULFATE (PF) 2 MG/ML IV SOLN
2.0000 mg | INTRAVENOUS | Status: DC | PRN
Start: 2015-10-28 — End: 2015-10-29
  Administered 2015-10-28: 2 mg via INTRAVENOUS
  Filled 2015-10-28: qty 1

## 2015-10-28 MED ORDER — ACETAMINOPHEN 650 MG RE SUPP: 650.0000 mg | Freq: Four times a day (QID) | RECTAL | Status: DC | PRN

## 2015-10-28 MED ORDER — NITROGLYCERIN 0.4 MG SL SUBL
0.4000 mg | SUBLINGUAL_TABLET | Freq: Once | SUBLINGUAL | Status: AC
  Administered 2015-10-28: 0.4 mg via SUBLINGUAL
  Filled 2015-10-28: qty 1

## 2015-10-28 MED ORDER — NIACIN 500 MG PO TABS
ORAL_TABLET | ORAL | Status: AC
  Filled 2015-10-28: qty 2

## 2015-10-28 MED ORDER — FAMOTIDINE 20 MG PO TABS: 20.0000 mg | ORAL_TABLET | Freq: Every day | ORAL | Status: DC

## 2015-10-28 MED ORDER — SIMVASTATIN 20 MG PO TABS
20.0000 mg | ORAL_TABLET | Freq: Every day | ORAL | Status: DC
  Administered 2015-10-28: 20 mg via ORAL
  Filled 2015-10-28: qty 1

## 2015-10-28 MED ORDER — ASPIRIN 325 MG PO TABS: 325.0000 mg | ORAL_TABLET | Freq: Every day | ORAL | Status: DC

## 2015-10-28 MED ORDER — NIACIN ER (ANTIHYPERLIPIDEMIC) 500 MG PO TBCR
1000.0000 mg | EXTENDED_RELEASE_TABLET | Freq: Two times a day (BID) | ORAL | Status: DC
  Administered 2015-10-28: 1000 mg via ORAL
  Filled 2015-10-28 (×4): qty 2

## 2015-10-28 MED ORDER — TERAZOSIN HCL 5 MG PO CAPS
5.0000 mg | ORAL_CAPSULE | Freq: Every day | ORAL | Status: DC
  Administered 2015-10-28: 5 mg via ORAL
  Filled 2015-10-28: qty 1

## 2015-10-28 MED ORDER — ACETAMINOPHEN 325 MG PO TABS: 650.0000 mg | ORAL_TABLET | Freq: Four times a day (QID) | ORAL | Status: DC | PRN

## 2015-10-28 MED ORDER — NITROGLYCERIN 2 % TD OINT
0.5000 [in_us] | TOPICAL_OINTMENT | Freq: Once | TRANSDERMAL | Status: AC
  Administered 2015-10-28: 0.5 [in_us] via TOPICAL
  Filled 2015-10-28: qty 1

## 2015-10-28 MED ORDER — ADULT MULTIVITAMIN W/MINERALS CH: 1.0000 | ORAL_TABLET | Freq: Every day | ORAL | Status: DC

## 2015-10-28 MED ORDER — MORPHINE SULFATE (PF) 4 MG/ML IV SOLN: 4.0000 mg | INTRAVENOUS | Status: DC | PRN

## 2015-10-28 MED ORDER — ENOXAPARIN SODIUM 60 MG/0.6ML ~~LOC~~ SOLN
60.0000 mg | SUBCUTANEOUS | Status: DC
  Administered 2015-10-28: 60 mg via SUBCUTANEOUS
  Filled 2015-10-28: qty 0.6

## 2015-10-28 MED ORDER — NITROGLYCERIN 0.4 MG SL SUBL
0.4000 mg | SUBLINGUAL_TABLET | Freq: Once | SUBLINGUAL | Status: AC
  Administered 2015-10-28: 0.4 mg via SUBLINGUAL

## 2015-10-28 MED ORDER — ASPIRIN 81 MG PO CHEW
324.0000 mg | CHEWABLE_TABLET | Freq: Once | ORAL | Status: DC
  Filled 2015-10-28: qty 4

## 2015-10-28 MED ORDER — OMEGA-3-ACID ETHYL ESTERS 1 G PO CAPS: 1.0000 g | ORAL_CAPSULE | Freq: Every day | ORAL | Status: DC

## 2015-10-28 MED ORDER — ONDANSETRON HCL 4 MG/2ML IJ SOLN: 4.0000 mg | Freq: Three times a day (TID) | INTRAMUSCULAR | Status: AC | PRN

## 2015-10-28 MED ORDER — POTASSIUM CHLORIDE CRYS ER 10 MEQ PO TBCR
10.0000 meq | EXTENDED_RELEASE_TABLET | Freq: Two times a day (BID) | ORAL | Status: DC
  Administered 2015-10-28: 10 meq via ORAL
  Filled 2015-10-28: qty 1

## 2015-10-28 NOTE — Consult Note (Signed)
Primary cardiologist: N/A Consulting cardiologist: Dr Herbert RichJonathan Gay Rape MD Requesting physician: Dr Eber HongBrian Miller MD Indication: Chest pain  Clinical Summary [name] is a 60 y.o.male history of CAD with prior inferior STEMI in 2008, received BMS to RCA, hyperlipidemia, HTN who presents with chest pain. Reports intermittent sharp 5/10 chest pain in midchest radiating to left should and arm. Lasts few seconds but can have multiple episodes within a day. Can occur at rest or with exertion. No other associated symptoms. Does note some increased DOE over the last few weeks. Chest pain has improved with prn NG.   K 4.3, Cr 0.82, Hgb 14.7, Plt 146, trop neg x 1 CXR no acute process EKG NSR    Allergies  Allergen Reactions  . Lisinopril   . Neosporin [Neomycin-Bacitracin Zn-Polymyx]   . Plavix [Clopidogrel Bisulfate]     Medications Scheduled Medications:     Infusions:     PRN Medications:  morphine   Past Medical History  Diagnosis Date  . MI (myocardial infarction) (HCC)   . Hypertension     Past Surgical History  Procedure Laterality Date  . Coronary stent placement      Family History History of CAD and CVA in his father. Denies any other family history of heart disease.   Social History [name] reports that he has quit smoking. He does not have any smokeless tobacco history on file. [name] reports that he drinks alcohol.  Review of Systems CONSTITUTIONAL: No weight loss, fever, chills, weakness or fatigue.  HEENT: Eyes: No visual loss, blurred vision, double vision or yellow sclerae. No hearing loss, sneezing, congestion, runny nose or sore throat.  SKIN: No rash or itching.  CARDIOVASCULAR: per HPI RESPIRATORY: No cough or sputum.  GASTROINTESTINAL: No anorexia, nausea, vomiting or diarrhea. No abdominal pain or blood.  GENITOURINARY: no polyuria, no dysuria NEUROLOGICAL: No headache, dizziness, syncope, paralysis, ataxia, numbness or  tingling in the extremities. No change in bowel or bladder control.  MUSCULOSKELETAL: No muscle, back pain, joint pain or stiffness.  HEMATOLOGIC: No anemia, bleeding or bruising.  LYMPHATICS: No enlarged nodes. No history of splenectomy.  PSYCHIATRIC: No history of depression or anxiety.      Physical Examination Blood pressure 139/79, pulse 61, temperature 97.7 F (36.5 C), temperature source Oral, resp. rate 15, height 5\' 7"  (1.702 m), weight 250 lb (113.399 kg), SpO2 97 %. No intake or output data in the 24 hours ending 10/28/15 1522  HEENT: sclera clear, throat clear  Cardiovascular: RRR, no m/r/g, no jvd  Respiratory: CTAB  GI: abdomen soft, NT, ND  MSK: no LE edema  Neuro: no focal deficits  Psych: appropriate affect   Lab Results  Basic Metabolic Panel:  Recent Labs Lab 10/28/15 1344  NA 139  K 4.3  CL 108  CO2 23  GLUCOSE 104*  BUN 14  CREATININE 0.82  CALCIUM 9.0    Liver Function Tests: No results for input(s): AST, ALT, ALKPHOS, BILITOT, PROT, ALBUMIN in the last 168 hours.  CBC:  Recent Labs Lab 10/28/15 1344  WBC 6.4  HGB 14.7  HCT 42.8  MCV 100.2*  PLT 146*    Cardiac Enzymes:  Recent Labs Lab 10/28/15 1344  TROPONINI <0.03    BNP: Invalid input(s): POCBNP     Impression/Recommendations 1. Chest pain - known history of CAD with prior MI and stent to RCA in 2008. Presents with chest pain suggesting possible cardiac etiology, though symptoms are not comlpletely consistent. Given his  symptoms and history recommend admission overnight with cycling of cardiac enzymes and EKG, make NPO at midnight for possible stress test vs cath. Please repeat EKG in AM.  - would not anticoagulated unless recurrence of pain or objective evidence of ischemia just as EKG changes or positive troponin. - check lipid, HgbA1c. - medical therapy with ASA, high dose statin. Soft bp's currently, would hold beta blocker at this time.     Herbert Compton, M.D.

## 2015-10-28 NOTE — ED Notes (Signed)
Pt reports LT sided intermittent CP that radiates down LT arm. Pt also reports dizziness. Hx of MI with stent placement. AOx4.

## 2015-10-28 NOTE — H&P (Signed)
PCP:   No primary care provider on file.   Chief Complaint:  Chest pain  HPI:  60 year old male who  has a past medical history of MI (myocardial infarction) (HCC) and Hypertension. today presents to the hospital with chest pain. Patient has a history of bare-metal stent to right coronary artery. Patient says this started having intermittent chest pain 3 days ago. Which was associated with radiation to left arm. Denies shortness of breath. No nausea vomiting or diarrhea. The pain was intermittent. in the ED cardiac enzymes  were negative. He was seen by cardiology.  Allergies:   Allergies  Allergen Reactions  . Lisinopril Other (See Comments)    Sore throat  . Neosporin [Neomycin-Bacitracin Zn-Polymyx] Other (See Comments)    Burn skin  . Plavix [Clopidogrel Bisulfate] Rash      Past Medical History  Diagnosis Date  . MI (myocardial infarction) (HCC)   . Hypertension     Past Surgical History  Procedure Laterality Date  . Coronary stent placement      Prior to Admission medications   Medication Sig Start Date End Date Taking? Authorizing Provider  carvedilol (COREG) 12.5 MG tablet Take 12.5 mg by mouth 2 (two) times daily with a meal.   Yes Historical Provider, MD  losartan (COZAAR) 25 MG tablet Take 12.5 mg by mouth daily.   Yes Historical Provider, MD  Multiple Vitamin (MULTIVITAMIN) tablet Take 1 tablet by mouth daily.   Yes Historical Provider, MD  niacin (NIASPAN) 1000 MG CR tablet Take 1,000 mg by mouth 2 (two) times daily.   Yes Historical Provider, MD  Omega-3 Fatty Acids (FISH OIL) 1000 MG CAPS Take 1,000 mg by mouth 2 (two) times daily.   Yes Historical Provider, MD  potassium citrate (UROCIT-K) 10 MEQ (1080 MG) SR tablet Take 10 mEq by mouth 2 (two) times daily.   Yes Historical Provider, MD  ranitidine (ZANTAC) 150 MG tablet Take 150 mg by mouth 2 (two) times daily.   Yes Historical Provider, MD  simvastatin (ZOCOR) 40 MG tablet Take 20 mg by mouth daily.    Yes Historical Provider, MD  terazosin (HYTRIN) 5 MG capsule Take 5 mg by mouth at bedtime.   Yes Historical Provider, MD    Social History:  reports that he has quit smoking. He does not have any smokeless tobacco history on file. He reports that he drinks alcohol. He reports that he does not use illicit drugs.  Patient father died of MI at the age of 23  Filed Weights   10/28/15 1221  Weight: 113.399 kg (250 lb)    All the positives are listed in BOLD  Review of Systems:  HEENT: Headache, blurred vision, runny nose, sore throat Neck: Hypothyroidism, hyperthyroidism,,lymphadenopathy Chest : Shortness of breath, history of COPD, Asthma Heart : Chest pain, history of coronary arterey disease GI:  Nausea, vomiting, diarrhea, constipation, GERD GU: Dysuria, urgency, frequency of urination, hematuria Neuro: Stroke, seizures, syncope Psych: Depression, anxiety, hallucinations   Physical Exam: Blood pressure 122/66, pulse 70, temperature 97.7 F (36.5 C), temperature source Oral, resp. rate 12, height  (1.702 m), weight 113.399 kg (250 lb), SpO2 95 %. Constitutional:   Patient is a well-developed and well-nourished male in no acute distress and cooperative with exam. Head: Normocephalic and atraumatic Mouth: Mucus membranes moist Eyes: PERRL, EOMI, conjunctivae normal Neck: Supple, No Thyromegaly Cardiovascular: RRR, S1 normal, S2 normal Pulmonary/Chest: CTAB, no wheezes, rales, or rhonchi Abdominal: Soft. Non-tender, non-distended, bowel sounds are normal,  no masses, organomegaly, or guarding present.  Neurological: A&O x3, Strength is normal and symmetric bilaterally, cranial nerve II-XII are grossly intact, no focal motor deficit, sensory intact to light touch bilaterally.  Extremities : No Cyanosis, Clubbing or Edema  Labs on Admission:  Basic Metabolic Panel:  Recent Labs Lab 10/28/15 1344  NA 139  K 4.3  CL 108  CO2 23  GLUCOSE 104*  BUN 14  CREATININE 0.82    CALCIUM 9.0   Liver Function Tests: No results for input(s): AST, ALT, ALKPHOS, BILITOT, PROT, ALBUMIN in the last 168 hours. No results for input(s): LIPASE, AMYLASE in the last 168 hours. No results for input(s): AMMONIA in the last 168 hours. CBC:  Recent Labs Lab 10/28/15 1344  WBC 6.4  HGB 14.7  HCT 42.8  MCV 100.2*  PLT 146*   Cardiac Enzymes:  Recent Labs Lab 10/28/15 1344  TROPONINI <0.03     Radiological Exams on Admission: Dg Chest 2 View  10/28/2015  CLINICAL DATA:  Chest pain EXAM: CHEST  2 VIEW COMPARISON:  10/10/2006 FINDINGS: Mild hyperinflation. Mild atelectasis or scarring in the lung bases. Negative for pneumonia. Heart size is normal. Negative for heart failure or effusion. No mass lesion. IMPRESSION: Hyperinflation with mild atelectasis/ scarring in the bases. No superimposed acute abnormality. Electronically Signed   By: Marlan Palauharles  Clark M.D.   On: 10/28/2015 13:03    EKG: Independently reviewed. Normal sinus rhythm   Assessment/Plan Active Problems:   Chest pain   Chest pain We'll admit the patient, cycle the cardiac enzymes. Rule out ACS. Cardiology has seen the patient and recommend nothing by mouth after midnight for possible stress test versus cath in a.m. Will check hemoglobin A1c. Will hold beta blocker as per cartilage recommendation.  Hypertension Hold Coreg as per cardiology recommendation.  DVT prophylaxis Lovenox  Code status: Full code  Family discussion: Admission, patients condition and plan of care including tests being ordered have been discussed with the patient and  his HCPOA at bedside who indicate understanding and agree with the plan and Code Status.   Time Spent on Admission: 60 min  LAMA,GAGAN S [facility] Pager: [phone] 10/28/2015, 4:10 PM  If 7PM-7AM, please contact night-coverage  [email or web address]  Password [email or web address]

## 2015-10-28 NOTE — ED Provider Notes (Signed)
CSN: [id number]     Arrival date & time 10/28/15  1207 History   First MD Initiated Contact with Patient 10/28/15 1420     Chief Complaint  Patient presents with  . Chest Pain     (Consider location/radiation/quality/duration/timing/severity/associated sxs/prior Treatment) HPI Comments: Patient is a 60yo M with a history of MI and coronary stent placement in 2012 who began experiencing chest pain on Wednesday. The pain has been intermittent. The pain is described as sharp, with associated L shoulder pain and a "weird feeling" in his L arm. Patient's pain has gradually before more frequent and prominent since onset. Patient rates pain as 2-3/10 now. The pain has occurred at rest and on exertion over the past few days. Patient tried his nitroglycerin tabs one day which resolved the pain. Some associated light-headedness and shortness of breath on exertion >188ft. No N/V/D, abdominal pain, numbness. Patient takes daily aspirin 325.  No other anticoagulation.  Patient is a 60 y.o. male presenting with chest pain. The history is provided by the patient and medical records. No language interpreter was used.  Chest Pain Associated symptoms: no abdominal pain, no back pain, no fever, no headache, no nausea, no numbness, no shortness of breath and not vomiting     Past Medical History  Diagnosis Date  . MI (myocardial infarction) (HCC)   . Hypertension    Past Surgical History  Procedure Laterality Date  . Coronary stent placement     No family history on file. Social History  Substance Use Topics  . Smoking status: Former Games developer  . Smokeless tobacco: None  . Alcohol Use: Yes     Comment: occ    Review of Systems  Constitutional: Negative for fever and chills.  HENT: Negative for facial swelling.   Respiratory: Negative for shortness of breath.   Cardiovascular: Positive for chest pain.  Gastrointestinal: Negative for nausea, vomiting and abdominal pain.  Genitourinary: Negative for  dysuria.  Musculoskeletal: Negative for back pain.  Skin: Negative for rash and wound.  Neurological: Positive for light-headedness. Negative for numbness and headaches.  Psychiatric/Behavioral: The patient is not nervous/anxious.       Allergies  Lisinopril; Neosporin; and Plavix  Home Medications   Prior to Admission medications   Not on File   BP 139/79 mmHg  Pulse 61  Temp(Src) 97.7 F (36.5 C) (Oral)  Resp 15  Ht  (1.702 m)  Wt 113.399 kg  BMI 39.15 kg/m2  SpO2 97% Physical Exam  Constitutional: He appears well-developed and well-nourished. No distress.  HENT:  Head: Normocephalic and atraumatic.  Eyes: Conjunctivae are normal. Right eye exhibits no discharge. Left eye exhibits no discharge. No scleral icterus.  Neck: Normal range of motion. Neck supple. No thyromegaly present.  Cardiovascular: Normal rate, regular rhythm, S1 normal, S2 normal, normal heart sounds and intact distal pulses.  Exam reveals no gallop and no friction rub.   No murmur heard. Pulmonary/Chest: Effort normal and breath sounds normal. No stridor. No respiratory distress. He has no wheezes. He has no rales.  Abdominal: Soft. Bowel sounds are normal. He exhibits no distension. There is no tenderness. There is no rebound and no guarding.  Musculoskeletal: He exhibits edema.  1+ pitting edema bilateral LEs  Lymphadenopathy:    He has no cervical adenopathy.  Neurological: He is alert. Coordination normal.  Skin: Skin is warm and dry. No rash noted. He is not diaphoretic. No pallor.  Psychiatric: He has a normal mood and affect.  Nursing  note and vitals reviewed.   ED Course  Procedures (including critical care time) Labs Review Labs Reviewed  BASIC METABOLIC PANEL - Abnormal; Notable for the following:    Glucose, Bld 104 (*)    All other components within normal limits  CBC - Abnormal; Notable for the following:    MCV 100.2 (*)    MCH 34.4 (*)    Platelets 146 (*)    All other  components within normal limits  TROPONIN I    Imaging Review Dg Chest 2 View  10/28/2015  CLINICAL DATA:  Chest pain EXAM: CHEST  2 VIEW COMPARISON:  10/10/2006 FINDINGS: Mild hyperinflation. Mild atelectasis or scarring in the lung bases. Negative for pneumonia. Heart size is normal. Negative for heart failure or effusion. No mass lesion. IMPRESSION: Hyperinflation with mild atelectasis/ scarring in the bases. No superimposed acute abnormality. Electronically Signed   By: Marlan Palauharles  Clark M.D.   On: 10/28/2015 13:03   I have personally reviewed and evaluated these images and lab results as part of my medical decision-making.   EKG Interpretation   Date/Time:  Monday October 28 2015 12:18:05 EDT Ventricular Rate:  66 PR Interval:  174 QRS Duration: 84 QT Interval:  392 QTC Calculation: 410 R Axis:   35 Text Interpretation:  Sinus rhythm with occasional Premature ventricular  complexes Otherwise normal ECG since last tracing no significant change  Confirmed by MILLER  MD, BRIAN ([id number]) on 10/28/2015 2:35:23 PM      MDM   Patient reports intermittent, sharp chest pain with radiation to L shoulder and arm since Wednesday. Patient has history of MI and coronary stent placement in 2012. Troponin <0.03. EKG no ischemic changes. CXR shows hyperinflation with mild atelectasis; no acute abnormality. BMP unremarkable. CBC unremarkable except slightly low PLT. Dr. Hyacinth MeekerMiller spoke with cardiology who would like to admit the patient to telemetry for observation where they will consult. Dr. Hyacinth MeekerMiller spoke with Dr. Sharl MaLama with family medicine who agreed to admit the patient to the service. Patient also evaluated by Dr. Hyacinth MeekerMiller who is in agreement with the plan.   Final diagnoses:  Chest pain, unspecified chest pain type       Emi Holeslexandra M Mykaylah Ballman, PA-C 10/28/15 1622  Eber HongBrian Miller, MD 10/30/15 [phone]

## 2015-10-28 NOTE — ED Notes (Signed)
EKG given to Dr. Miller.  

## 2015-10-28 NOTE — ED Provider Notes (Signed)
The patient is a 60 year old male, he has a history of coronary disease status post stenting, he states that he is fairly immobile, he does not get a very often but has been doing some fishing recently. He has noticed that he has been having some increased pain in his chest, some pain radiating to the left shoulder and left arm, this is intermittent, it is not always associated with any activity, there is occasionally some shortness of breath as well. On exam the patient has clear heart and lung sounds, there is no murmur, no abnormal lung sounds, no peripheral edema, no JVD. EKG is nonischemic, labs have been drawn so far are unremarkable, his new fluctuating chest pain is concerning for anginal or unstable anginal pains, will likely need admission for observation and cardiology consultation.  Cardiology consultatin - they will see in contult  D/w hospitalist - will admit   EKG Interpretation  Date/Time:  Monday October 28 2015 12:18:05 EDT Ventricular Rate:  66 PR Interval:  174 QRS Duration: 84 QT Interval:  392 QTC Calculation: 410 R Axis:   35 Text Interpretation:  Sinus rhythm with occasional Premature ventricular complexes Otherwise normal ECG since last tracing no significant change Confirmed by Annica Marinello  MD, Darnetta Kesselman ([id number]) on 10/28/2015 2:35:23 PM      Medical screening examination/treatment/procedure(s) were conducted as a shared visit with non-physician practitioner(s) and myself.  I personally evaluated the patient during the encounter.  Clinical Impression:   Final diagnoses:  Chest pain, unspecified chest pain type         Eber HongBrian Anina Schnake, MD 10/30/15 [phone]

## 2015-10-29 ENCOUNTER — Observation Stay (HOSPITAL_BASED_OUTPATIENT_CLINIC_OR_DEPARTMENT_OTHER): Payer: Non-veteran care

## 2015-10-29 ENCOUNTER — Observation Stay (HOSPITAL_COMMUNITY): Payer: Non-veteran care

## 2015-10-29 ENCOUNTER — Encounter (HOSPITAL_COMMUNITY): Payer: Self-pay

## 2015-10-29 DIAGNOSIS — I2510 Atherosclerotic heart disease of native coronary artery without angina pectoris: Secondary | ICD-10-CM | POA: Diagnosis not present

## 2015-10-29 DIAGNOSIS — E785 Hyperlipidemia, unspecified: Secondary | ICD-10-CM

## 2015-10-29 DIAGNOSIS — R079 Chest pain, unspecified: Secondary | ICD-10-CM | POA: Diagnosis not present

## 2015-10-29 DIAGNOSIS — R0789 Other chest pain: Secondary | ICD-10-CM | POA: Diagnosis not present

## 2015-10-29 LAB — LIPID PANEL
Cholesterol: 132 mg/dL (ref 0–200)
HDL: 32 mg/dL — AB (ref >40)
LDL Cholesterol: 73 mg/dL (ref 0–99)
Total CHOL/HDL Ratio: 4.1 RATIO
Triglycerides: 133 mg/dL (ref <150)
VLDL: 27 mg/dL (ref 0–40)

## 2015-10-29 LAB — NM MYOCAR MULTI W/SPECT W/WALL MOTION / EF
CHL CUP NUCLEAR SRS: 4
CHL CUP NUCLEAR SSS: 5
CHL CUP RESTING HR STRESS: 67 {beats}/min
LV dias vol: 87 mL (ref 62–150)
LV sys vol: 27 mL
NUC STRESS TID: 1.1
Peak HR: 83 {beats}/min
RATE: 0.35
SDS: 1

## 2015-10-29 LAB — COMPREHENSIVE METABOLIC PANEL
ALBUMIN: 3.6 g/dL (ref 3.5–5.0)
ALT: 45 U/L (ref 17–63)
AST: 37 U/L (ref 15–41)
Alkaline Phosphatase: 37 U/L — ABNORMAL LOW (ref 38–126)
Anion gap: 10 (ref 5–15)
BUN: 14 mg/dL (ref 6–20)
CHLORIDE: 108 mmol/L (ref 101–111)
CO2: 25 mmol/L (ref 22–32)
CREATININE: 0.92 mg/dL (ref 0.61–1.24)
Calcium: 8.8 mg/dL — ABNORMAL LOW (ref 8.9–10.3)
GFR calc non Af Amer: >60 mL/min (ref >60)
GLUCOSE: 85 mg/dL (ref 65–99)
Potassium: 3.8 mmol/L (ref 3.5–5.1)
SODIUM: 143 mmol/L (ref 135–145)
Total Bilirubin: 0.8 mg/dL (ref 0.3–1.2)
Total Protein: 6.3 g/dL — ABNORMAL LOW (ref 6.5–8.1)

## 2015-10-29 LAB — CBC
HEMATOCRIT: 41.1 % (ref 39.0–52.0)
HEMOGLOBIN: 13.9 g/dL (ref 13.0–17.0)
MCH: 34.1 pg — ABNORMAL HIGH (ref 26.0–34.0)
MCHC: 33.8 g/dL (ref 30.0–36.0)
MCV: 100.7 fL — ABNORMAL HIGH (ref 78.0–100.0)
Platelets: 137 10*3/uL — ABNORMAL LOW (ref 150–400)
RBC: 4.08 MIL/uL — ABNORMAL LOW (ref 4.22–5.81)
RDW: 13 % (ref 11.5–15.5)
WBC: 5.6 10*3/uL (ref 4.0–10.5)

## 2015-10-29 LAB — TROPONIN I

## 2015-10-29 MED ORDER — ASPIRIN EC 81 MG PO TBEC: 81.0000 mg | DELAYED_RELEASE_TABLET | Freq: Every day | ORAL | Status: AC

## 2015-10-29 MED ORDER — REGADENOSON 0.4 MG/5ML IV SOLN
0.4000 mg | Freq: Once | INTRAVENOUS | Status: DC
Start: 2015-10-30 — End: 2015-10-29
  Filled 2015-10-29: qty 5

## 2015-10-29 MED ORDER — ALBUTEROL SULFATE (2.5 MG/3ML) 0.083% IN NEBU: 2.5000 mg | INHALATION_SOLUTION | RESPIRATORY_TRACT | Status: DC | PRN

## 2015-10-29 MED ORDER — ALBUTEROL SULFATE HFA 108 (90 BASE) MCG/ACT IN AERS: 2.0000 | INHALATION_SPRAY | Freq: Four times a day (QID) | RESPIRATORY_TRACT | Status: AC | PRN

## 2015-10-29 MED ORDER — ATORVASTATIN CALCIUM 20 MG PO TABS: 20.0000 mg | ORAL_TABLET | Freq: Every day | ORAL | Status: AC

## 2015-10-29 MED ORDER — CARVEDILOL 6.25 MG PO TABS: 12.5000 mg | ORAL_TABLET | Freq: Two times a day (BID) | ORAL | Status: AC

## 2015-10-29 MED ORDER — ALBUTEROL SULFATE HFA 108 (90 BASE) MCG/ACT IN AERS: 2.0000 | INHALATION_SPRAY | RESPIRATORY_TRACT | Status: DC | PRN

## 2015-10-29 MED ORDER — CARVEDILOL 3.125 MG PO TABS: 3.1250 mg | ORAL_TABLET | Freq: Two times a day (BID) | ORAL | Status: DC

## 2015-10-29 MED ORDER — TECHNETIUM TC 99M SESTAMIBI - CARDIOLITE
30.0000 | Freq: Once | INTRAVENOUS | Status: AC | PRN
  Administered 2015-10-29: 11:00:00 28 via INTRAVENOUS

## 2015-10-29 MED ORDER — REGADENOSON 0.4 MG/5ML IV SOLN
INTRAVENOUS | Status: AC
  Administered 2015-10-29: 0.4 mg via INTRAVENOUS
  Filled 2015-10-29: qty 5

## 2015-10-29 MED ORDER — SODIUM CHLORIDE 0.9% FLUSH
INTRAVENOUS | Status: AC
  Administered 2015-10-29: 10 mL via INTRAVENOUS
  Filled 2015-10-29: qty 10

## 2015-10-29 MED ORDER — TECHNETIUM TC 99M SESTAMIBI GENERIC - CARDIOLITE
10.0000 | Freq: Once | INTRAVENOUS | Status: AC | PRN
  Administered 2015-10-29: 11 via INTRAVENOUS

## 2015-10-29 NOTE — Discharge Summary (Signed)
Physician Discharge Summary  Herbert Compton XBJ:[id number] DOB: 12/02/1955 DOA: 10/28/2015  PCP: No primary care provider on file.  Admit date: 10/28/2015 Discharge date: 10/29/2015  Time spent: >45 minutes  Recommendations for Outpatient Follow-up:  PCP in 3-7 days as needed  Discharge Diagnoses:  Active Problems:   Chest pain   Hyperlipidemia   CAD (coronary artery disease)   Discharge Condition: stable   Diet recommendation: heart healthy   Filed Weights   10/28/15 1221 10/28/15 1851  Weight: 113.399 kg (250 lb) 113.399 kg (250 lb)    History of present illness:  60 y/o male with PMH of h/o Tobacco use, HTN, CAD (stent 2008) presented with intermittent chest pains. Patient underwent stress test    Hospital Course:  Chest pain. No active chest symptoms at the time of exam. Trop: negative. ECG. No ACS. But hight risk for angina. Patient underwent stress test: Findings consistent with prior inferior myocardial infarction with very mild peri-infarct ischemia. Cannot rule out inferior wall artifact as the inferior wall has normal wall motion and there is significant radiotracer uptake in the adjacent gut. Either finding supports overall low risk -cardiology recommended to cont medical treatment. cont ASA, statin, BB   Probable underlying COPD. 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 Prn bronchodilators, recommended outpatient PFTs.  polysomnography r/o OSA  HTN. Stable cont current regimen     Procedures:  Stress t est as above  (i.e. Studies not automatically included, echos, thoracentesis, etc; not x-rays)  Consultations:  Cardiology   Discharge Exam: Filed Vitals:   10/28/15 2230 10/29/15 0700  BP: 137/75 130/62  Pulse: 69 82  Temp:  97.9 F (36.6 C)  Resp:  18    General: alert. No distress  Cardiovascular: s1,s2 rrr Respiratory: CTA BL  Discharge Instructions  Discharge Instructions    Diet - low sodium heart healthy     Complete by:  As directed      Discharge instructions    Complete by:  As directed   Please follow up with primary care doctor  In 1 week as needed     Increase activity slowly    Complete by:  As directed             Medication List    STOP taking these medications        simvastatin 40 MG tablet  Commonly known as:  ZOCOR      TAKE these medications        albuterol 108 (90 Base) MCG/ACT inhaler  Commonly known as:  PROVENTIL HFA;VENTOLIN HFA  Inhale 2 puffs into the lungs every 6 (six) hours as needed for wheezing or shortness of breath.     aspirin EC 81 MG tablet  Take 1 tablet (81 mg total) by mouth daily.     atorvastatin 20 MG tablet  Commonly known as:  LIPITOR  Take 1 tablet (20 mg total) by mouth daily.     carvedilol 6.25 MG tablet  Commonly known as:  COREG  Take 2 tablets (12.5 mg total) by mouth 2 (two) times daily with a meal.     Fish Oil 1000 MG Caps  Take 1,000 mg by mouth 2 (two) times daily.     losartan 25 MG tablet  Commonly known as:  COZAAR  Take 12.5 mg by mouth daily.     multivitamin tablet  Take 1 tablet by mouth daily.     niacin 1000 MG CR tablet  Commonly known as:  NIASPAN  Take 1,000 mg by mouth 2 (two) times daily.     potassium citrate 10 MEQ (1080 MG) SR tablet  Commonly known as:  UROCIT-K  Take 10 mEq by mouth 2 (two) times daily.     ranitidine 150 MG tablet  Commonly known as:  ZANTAC  Take 150 mg by mouth 2 (two) times daily.     terazosin 5 MG capsule  Commonly known as:  HYTRIN  Take 5 mg by mouth at bedtime.       Allergies  Allergen Reactions  . Lisinopril Other (See Comments)    Sore throat  . Neosporin [Neomycin-Bacitracin Zn-Polymyx] Other (See Comments)    Burn skin  . Plavix [Clopidogrel Bisulfate] Rash      The results of significant diagnostics from this hospitalization (including imaging, microbiology, ancillary and laboratory) are listed below for reference.    Significant Diagnostic  Studies: Dg Chest 2 View  10/28/2015  CLINICAL DATA:  Chest pain EXAM: CHEST  2 VIEW COMPARISON:  10/10/2006 FINDINGS: Mild hyperinflation. Mild atelectasis or scarring in the lung bases. Negative for pneumonia. Heart size is normal. Negative for heart failure or effusion. No mass lesion. IMPRESSION: Hyperinflation with mild atelectasis/ scarring in the bases. No superimposed acute abnormality. Electronically Signed   By: Marlan Palau M.D.   On: 10/28/2015 13:03   Nm Myocar Multi W/spect W/wall Motion / Ef  10/29/2015   There was no ST segment deviation noted during stress.  This is a low risk study.  The left ventricular ejection fraction is hyperdynamic (>65%).  Findings consistent with prior inferior myocardial infarction with very mild peri-infarct ischemia. Cannot rule out inferior wall artifact as the inferior wall has normal wall motion and there is significant radiotracer uptake in the adjacent gut. Either finding supports overall low risk     Microbiology: No results found for this or any previous visit (from the past 240 hour(s)).   Labs: Basic Metabolic Panel:  Recent Labs Lab 10/28/15 1344 10/29/15 0506  NA 139 143  K 4.3 3.8  CL 108 108  CO2 23 25  GLUCOSE 104* 85  BUN 14 14  CREATININE 0.82 0.92  CALCIUM 9.0 8.8*   Liver Function Tests:  Recent Labs Lab 10/29/15 0506  AST 37  ALT 45  ALKPHOS 37*  BILITOT 0.8  PROT 6.3*  ALBUMIN 3.6   No results for input(s): LIPASE, AMYLASE in the last 168 hours. No results for input(s): AMMONIA in the last 168 hours. CBC:  Recent Labs Lab 10/28/15 1344 10/29/15 0506  WBC 6.4 5.6  HGB 14.7 13.9  HCT 42.8 41.1  MCV 100.2* 100.7*  PLT 146* 137*   Cardiac Enzymes:  Recent Labs Lab 10/28/15 1344 10/28/15 1913 10/29/15 0038 10/29/15 0506  TROPONINI <0.03 <0.03 <0.03 <0.03   BNP: BNP (last 3 results) No results for input(s): BNP in the last 8760 hours.  ProBNP (last 3 results) No results for input(s):  PROBNP in the last 8760 hours.  CBG: No results for input(s): GLUCAP in the last 168 hours.     SignedEsperanza Sheets  [facility] 10/29/2015, 2:19 PM

## 2015-10-29 NOTE — Progress Notes (Signed)
[facility] PROGRESS NOTE  Herbert Compton WUJ:[id number] DOB: March 20, 1956 DOA: 10/28/2015 PCP: No primary care provider on file.  Assessment/Plan: 60 y/o male with PMH of h/o Tobacco use, HTN, CAD (stent 2008) presented with intermittent chest pains. Scheduled for stress test today   Chest pain. No active chest symptoms at the time of exam. Trop: negative. ECG. No ACS. But hight risk for angina. Awaiting stress test today. Cont ASA, statin, BB (BP is stable), NTG as needed. Also may need echo due to mild leg edema. Defer to cards. appreciate the input    Probable underlying COPD. 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 Prn bronchodilators, recommended outpatient PFTs. Also need polysomnography r/o OSA  HTN. Stable cont current regimen, monitor     Code Status: full Family Communication: d/w patient (indicate person spoken with, relationship, and if by phone, the number) Disposition Plan: home 24-48 hrs    Consultants:  Cardiology   Procedures:  Pend stress test   Antibiotics:  none (indicate start date, and stop date if known)  HPI/Subjective: Alert,. No distress   Objective: Filed Vitals:   10/28/15 2230 10/29/15 0700  BP: 137/75 130/62  Pulse: 69 82  Temp:  97.9 F (36.6 C)  Resp:  18   No intake or output data in the 24 hours ending 10/29/15 1006 Filed Weights   10/28/15 1221 10/28/15 1851  Weight: 113.399 kg (250 lb) 113.399 kg (250 lb)    Exam:   General:  Comfortable   Cardiovascular: s1,s2 rrr  Respiratory: CTA BL   Abdomen: soft, nt,n d  Musculoskeletal: mild leg edema    Data Reviewed: Basic Metabolic Panel:  Recent Labs Lab 10/28/15 1344 10/29/15 0506  NA 139 143  K 4.3 3.8  CL 108 108  CO2 23 25  GLUCOSE 104* 85  BUN 14 14  CREATININE 0.82 0.92  CALCIUM 9.0 8.8*   Liver Function Tests:  Recent Labs Lab 10/29/15 0506  AST 37  ALT 45  ALKPHOS 37*  BILITOT 0.8  PROT 6.3*  ALBUMIN 3.6   No  results for input(s): LIPASE, AMYLASE in the last 168 hours. No results for input(s): AMMONIA in the last 168 hours. CBC:  Recent Labs Lab 10/28/15 1344 10/29/15 0506  WBC 6.4 5.6  HGB 14.7 13.9  HCT 42.8 41.1  MCV 100.2* 100.7*  PLT 146* 137*   Cardiac Enzymes:  Recent Labs Lab 10/28/15 1344 10/28/15 1913 10/29/15 0038 10/29/15 0506  TROPONINI <0.03 <0.03 <0.03 <0.03   BNP (last 3 results) No results for input(s): BNP in the last 8760 hours.  ProBNP (last 3 results) No results for input(s): PROBNP in the last 8760 hours.  CBG: No results for input(s): GLUCAP in the last 168 hours.  No results found for this or any previous visit (from the past 240 hour(s)).   Studies: Dg Chest 2 View  10/28/2015  CLINICAL DATA:  Chest pain EXAM: CHEST  2 VIEW COMPARISON:  10/10/2006 FINDINGS: Mild hyperinflation. Mild atelectasis or scarring in the lung bases. Negative for pneumonia. Heart size is normal. Negative for heart failure or effusion. No mass lesion. IMPRESSION: Hyperinflation with mild atelectasis/ scarring in the bases. No superimposed acute abnormality. Electronically Signed   By: Marlan Palau M.D.   On: 10/28/2015 13:03    Scheduled Meds: . aspirin  324 mg Oral Once  . aspirin  325 mg Oral Daily  . enoxaparin (LOVENOX) injection  60 mg Subcutaneous Q24H  . famotidine  20 mg Oral Daily  . multivitamin with minerals  1 tablet Oral Daily  . niacin  1,000 mg Oral BID  . omega-3 acid ethyl esters  1 g Oral Daily  . potassium chloride  10 mEq Oral BID  . [START ON 10/30/2015] regadenoson  0.4 mg Intravenous Once  . simvastatin  20 mg Oral q1800  . terazosin  5 mg Oral QHS   Continuous Infusions:   Active Problems:   Chest pain   Hyperlipidemia   CAD (coronary artery disease)    Time spent: >35 minutes     Esperanza SheetsBURIEV, Giomar Gusler N  [facility] Pager [phone]. If 7PM-7AM, please contact night-coverage at [email or web address], password [email or web address] 10/29/2015, 10:06 AM

## 2015-10-29 NOTE — Progress Notes (Signed)
Primary Cardiologist: Dietrich Patesoss, Paula MD  Cardiology Specific Problem List: 1. Chest Pain 2. 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 Hypertension  Subjective:    Continues some chest pressure which he says is constant for one week,  but sharp intermittent chest pain has subsided.   Objective:   Temp:  [97.7 F (36.5 C)-97.9 F (36.6 C)] 97.9 F (36.6 C) (03/21 0700) Pulse Rate:  [61-94] 82 (03/21 0700) Resp:  [11-19] 18 (03/21 0700) BP: (93-181)/(61-93) 130/62 mmHg (03/21 0700) SpO2:  [93 %-100 %] 96 % (03/21 0700) Weight:  [250 lb (113.399 kg)] 250 lb (113.399 kg) (03/20 1851) Last BM Date: 10/27/15  Filed Weights   10/28/15 1221 10/28/15 1851  Weight: 250 lb (113.399 kg) 250 lb (113.399 kg)   No intake or output data in the 24 hours ending 10/29/15 0812    Exam:  General: No acute distress.  HEENT: Conjunctiva and lids normal, oropharynx clear.  Lungs: Clear to auscultation, nonlabored.  Cardiac: No elevated JVP or bruits. RRR, no gallop or rub.   Abdomen: Normoactive bowel sounds, nontender, nondistended. Obese.   Extremities: No pitting edema, distal pulses full.  Neuropsychiatric: Alert and oriented x3, affect appropriate.   Lab Results:  Basic Metabolic Panel:  Recent Labs Lab 10/28/15 1344 10/29/15 0506  NA 139 143  K 4.3 3.8  CL 108 108  CO2 23 25  GLUCOSE 104* 85  BUN 14 14  CREATININE 0.82 0.92  CALCIUM 9.0 8.8*    Liver Function Tests:  Recent Labs Lab 10/29/15 0506  AST 37  ALT 45  ALKPHOS 37*  BILITOT 0.8  PROT 6.3*  ALBUMIN 3.6    CBC:  Recent Labs Lab 10/28/15 1344 10/29/15 0506  WBC 6.4 5.6  HGB 14.7 13.9  HCT 42.8 41.1  MCV 100.2* 100.7*  PLT 146* 137*    Cardiac Enzymes:  Recent Labs Lab 10/28/15 1913 10/29/15 0038 10/29/15 0506  TROPONINI <0.03 <0.03 <0.03    Radiology: Dg Chest 2 View  10/28/2015  CLINICAL DATA:  Chest pain EXAM: CHEST  2 VIEW COMPARISON:  10/10/2006 FINDINGS: Mild hyperinflation. Mild  atelectasis or scarring in the lung bases. Negative for pneumonia. Heart size is normal. Negative for heart failure or effusion. No mass lesion. IMPRESSION: Hyperinflation with mild atelectasis/ scarring in the bases. No superimposed acute abnormality. Electronically Signed   By: Marlan Palauharles  Clark M.D.   On: 10/28/2015 13:03     Medications:   Scheduled Medications: . aspirin  324 mg Oral Once  . aspirin  325 mg Oral Daily  . enoxaparin (LOVENOX) injection  60 mg Subcutaneous Q24H  . famotidine  20 mg Oral Daily  . multivitamin with minerals  1 tablet Oral Daily  . niacin  1,000 mg Oral BID  . omega-3 acid ethyl esters  1 g Oral Daily  . potassium chloride  10 mEq Oral BID  . [START ON 10/30/2015] regadenoson  0.4 mg Intravenous Once  . simvastatin  20 mg Oral q1800  . terazosin  5 mg Oral QHS    Infusions:    PRN Medications: acetaminophen **OR** acetaminophen, morphine injection   Assessment and Plan:   1.Chest Pain: Sharp chest pain has completely resolved. Troponin is negative X 3. Continues to have some constant pressure, however, which has been ongoing since Wednesday of last week, with some intermittent arm numbness. Discussed stress MPI, and patient is willing to proceed. Will plan for today for diagnostic/prognositc purposes.  He has been NPO  2. CAD: Hx of BMS to distal RCA. Per cath in 2012, he had 40% narrowing in the proximal LAD, and no significant obstruction of the circumflex.  Continues on ASA, statin.    3. Hypertension: BP essentially normal since admission compared to hypertension noted in ER. Not currently on antihypertensives this admission.   Bettey Mare. Lawrence NP [facility]  10/29/2015, 8:12 AM   Attending Note Patient seen and discussed with NP Lyman Bishop, I agree with her documentation. Admitted with somewhat mixed chest, no objective evidence of ischemia by EKG or enzymes. Stress test shows probable old inferior infarct with very mild peri-infarct ischemia,  however cannot rule possible artifact due to adjacent gut radiotracer uptake. Either finding is low risk. Recommend medical therapy at this time. Continue ASA. In setting of CAD based on most recent lipid guidelines he should be on high dose statin, I recommend changing simva to atorvastatin. There is no clinical benefit proven for niacin, would recommend discontinuing. Continue beta blocker, at f/u if bp's tolerable he should be on low dose ACE-I given the additional cardiovascular outcome benefits. Realize his medications and care are through the [place] so alterations on our end are limited, he needs close f/u at discharge. We will sign off inpatient care, [facility]   Dominga Ferry MD

## 2015-10-29 NOTE — Progress Notes (Signed)
Patient discharged with [facility] to [facility] within 3 to 7 business days for an appointment. Patient agreed to make follow up appointment himself due to time restraints on transportation home.

## 2015-10-30 LAB — HEMOGLOBIN A1C
HEMOGLOBIN A1C: 6.1 % — AB (ref 4.8–5.6)
Mean Plasma Glucose: 128 mg/dL

## 2017-08-27 IMAGING — NM NM MYOCAR MULTI W/SPECT W/WALL MOTION & EF
2 series · 12 of 12 positions shown · non-contrast
Comparison: none

[Series 1: rest · 8.28mm/px · 6 of 64 frames shown]
[frame 6/64]
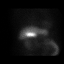
[frame 16/64]
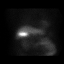
[frame 27/64]
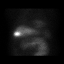
[frame 38/64]
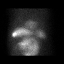
[frame 48/64]
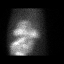
[frame 59/64]
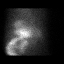

[Series 2: stress gated · 8.28mm/px · 6 of 64 frames shown]
[frame 6/64]
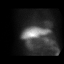
[frame 16/64]
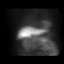
[frame 27/64]
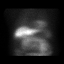
[frame 38/64]
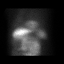
[frame 48/64]
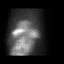
[frame 59/64]
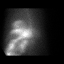

[12 of 12 positions shown; findings below may reference images not displayed]

Canned report from images found in remote index.

Refer to host system for actual result text.

## 2020-03-26 ENCOUNTER — Ambulatory Visit (INDEPENDENT_AMBULATORY_CARE_PROVIDER_SITE_OTHER): Payer: No Typology Code available for payment source | Admitting: Podiatry

## 2020-03-26 ENCOUNTER — Ambulatory Visit (INDEPENDENT_AMBULATORY_CARE_PROVIDER_SITE_OTHER): Payer: No Typology Code available for payment source

## 2020-03-26 ENCOUNTER — Other Ambulatory Visit: Payer: Self-pay

## 2020-03-26 DIAGNOSIS — M19072 Primary osteoarthritis, left ankle and foot: Secondary | ICD-10-CM | POA: Diagnosis not present

## 2020-03-26 DIAGNOSIS — M2142 Flat foot [pes planus] (acquired), left foot: Secondary | ICD-10-CM

## 2020-03-26 DIAGNOSIS — M19071 Primary osteoarthritis, right ankle and foot: Secondary | ICD-10-CM

## 2020-03-26 DIAGNOSIS — M2141 Flat foot [pes planus] (acquired), right foot: Secondary | ICD-10-CM | POA: Diagnosis not present

## 2020-03-26 DIAGNOSIS — M19079 Primary osteoarthritis, unspecified ankle and foot: Secondary | ICD-10-CM

## 2020-03-26 DIAGNOSIS — G629 Polyneuropathy, unspecified: Secondary | ICD-10-CM

## 2020-03-26 MED ORDER — MELOXICAM 15 MG PO TABS: 15.0000 mg | ORAL_TABLET | Freq: Every day | ORAL | 1 refills | Status: AC

## 2020-03-26 MED ORDER — GABAPENTIN 100 MG PO CAPS: 100.0000 mg | ORAL_CAPSULE | Freq: Three times a day (TID) | ORAL | 3 refills | Status: AC

## 2020-03-26 NOTE — Progress Notes (Signed)
   HPI: 64 y.o. male presenting today as a new patient for evaluation of bilateral foot pain.  Patient has been diagnosed with peripheral polyneuropathy in the past.  He states that he gets the feeling that his socks are bunched up in his feet.  He also experiences chronic foot pain on a daily basis.  He denies injury or history of trauma.  He presents for further treatment evaluation  Past Medical History:  Diagnosis Date  . Hypertension   . MI (myocardial infarction)      Physical Exam: General: The patient is alert and oriented x3 in no acute distress.  Dermatology: Skin is warm, dry and supple bilateral lower extremities. Negative for open lesions or macerations.  Vascular: Palpable pedal pulses bilaterally. No edema or erythema noted. Capillary refill within normal limits.  Neurological: Epicritic and protective threshold grossly intact bilaterally.   Musculoskeletal Exam: Range of motion within normal limits to all pedal and ankle joints bilateral. Muscle strength 5/5 in all groups bilateral.  There is pain on palpation throughout the bilateral midtarsal joints extending into the metatarsal heads consistent with findings of metatarsalgia.  Collapse of the medial longitudinal arch noted bilateral feet  Radiographic Exam:  Normal osseous mineralization.  There is some mild joint space narrowing consistent with findings of DJD throughout some of the pedal structures of the foot. No fracture/dislocation/boney destruction.  Collapse of the calcaneal inclination angle and metatarsal declination angle consistent with findings of pes planus.  Assessment: 1.  Peripheral polyneuropathy 2.  DJD/capsulitis bilateral feet 3.  Pes planus bilateral   Plan of Care:  1. Patient evaluated. X-Rays reviewed.  2.  Prescription for gabapentin 100 mg 3 times daily 3.  Prescription for meloxicam 15 mg daily 4.  Prescription provided today for custom molded insoles to take to the [place] for approval 5.   Return to clinic as needed      Felecia Shelling, DPM [facility]  Dr. Felecia Shelling, DPM    [address] [place], [place] [address]                Office [phone]  Fax [phone]

## 2020-05-07 ENCOUNTER — Other Ambulatory Visit: Payer: Self-pay

## 2020-05-07 ENCOUNTER — Ambulatory Visit (INDEPENDENT_AMBULATORY_CARE_PROVIDER_SITE_OTHER): Payer: No Typology Code available for payment source | Admitting: Podiatry

## 2020-05-07 DIAGNOSIS — M2141 Flat foot [pes planus] (acquired), right foot: Secondary | ICD-10-CM

## 2020-05-07 DIAGNOSIS — M2142 Flat foot [pes planus] (acquired), left foot: Secondary | ICD-10-CM | POA: Diagnosis not present

## 2020-05-07 DIAGNOSIS — G629 Polyneuropathy, unspecified: Secondary | ICD-10-CM

## 2020-05-07 DIAGNOSIS — M19079 Primary osteoarthritis, unspecified ankle and foot: Secondary | ICD-10-CM | POA: Diagnosis not present

## 2020-05-07 NOTE — Progress Notes (Signed)
   HPI: 64 y.o. male presenting today for follow-up and evaluation of polyneuropathy to the lower extremities bilateral. Patient states that he has noticed significant improvement. The gabapentin and the meloxicam are helping significantly with his pain and elements to his lower extremities. No new complaints at this time.  Past Medical History:  Diagnosis Date  . Hypertension   . MI (myocardial infarction)      Physical Exam: General: The patient is alert and oriented x3 in no acute distress.  Dermatology: Skin is warm, dry and supple bilateral lower extremities. Negative for open lesions or macerations.  Vascular: Palpable pedal pulses bilaterally. No edema or erythema noted. Capillary refill within normal limits.  Neurological: Epicritic and protective threshold grossly intact bilaterally.   Musculoskeletal Exam: Range of motion within normal limits to all pedal and ankle joints bilateral. Muscle strength 5/5 in all groups bilateral.  There is pain on palpation throughout the bilateral midtarsal joints extending into the metatarsal heads consistent with findings of metatarsalgia.  Collapse of the medial longitudinal arch noted bilateral feet  Assessment: 1.  Peripheral polyneuropathy 2.  DJD/capsulitis bilateral feet 3.  Pes planus bilateral   Plan of Care:  1. Patient evaluated.  2. Continue gabapentin 100 mg 3 times daily 3. Continue meloxicam 15 mg daily 4. Patient states that he is going in tomorrow to the [place] to get his custom molded insoles 5.  Return to clinic as needed      Felecia Shelling, DPM [facility]  Dr. Felecia Shelling, DPM    [address] [place], [place] [address]                Office [phone]  Fax [phone]

## 2021-02-09 ENCOUNTER — Encounter: Payer: Self-pay | Admitting: Emergency Medicine

## 2021-02-09 ENCOUNTER — Other Ambulatory Visit: Payer: Self-pay

## 2021-02-09 ENCOUNTER — Ambulatory Visit
Admission: EM | Admit: 2021-02-09 | Discharge: 2021-02-09 | Disposition: A | Discharge status: Home / Self Care | Payer: Non-veteran care

## 2021-02-09 ENCOUNTER — Ambulatory Visit (INDEPENDENT_AMBULATORY_CARE_PROVIDER_SITE_OTHER): Payer: Non-veteran care

## 2021-02-09 DIAGNOSIS — M79641 Pain in right hand: Secondary | ICD-10-CM

## 2021-02-09 DIAGNOSIS — S62637A Displaced fracture of distal phalanx of left little finger, initial encounter for closed fracture: Secondary | ICD-10-CM | POA: Diagnosis not present

## 2021-02-09 HISTORY — DX: Type 2 diabetes mellitus without complications: E11.9

## 2021-02-09 MED ORDER — TIZANIDINE HCL 4 MG PO TABS: 4.0000 mg | ORAL_TABLET | Freq: Every day | ORAL | 0 refills | Status: AC

## 2021-02-09 NOTE — ED Provider Notes (Signed)
[facility]    CSN: [id number] Arrival date & time: 02/09/21  1326      History   Chief Complaint Chief Complaint  Patient presents with   Hand Injury    HPI Herbert THIELEN is a 65 y.o. male.   HPI Patient reports earlier today while at home slipping and subsequently tripping and landing with most of his weight on his right fifth finger.  He immediately experienced pain and and over the last few hours the swelling has worsened which is subsequently increased the pain.  He is concerned for possible fracture.  He has a small puncture wound at the proximal region of the fifth finger .  He denies any other injuries Past Medical History:  Diagnosis Date   Diabetes mellitus without complication (HCC)    Hypertension    MI (myocardial infarction) [facility])     Patient Active Problem List   Diagnosis Date Noted   Chest pain 10/28/2015   Hyperlipidemia 10/28/2015   CAD (coronary artery disease) 10/28/2015    Past Surgical History:  Procedure Laterality Date   CORONARY STENT PLACEMENT     HERNIA REPAIR     TONSILLECTOMY         Home Medications    Prior to Admission medications   Medication Sig Start Date End Date Taking? Authorizing Provider  aspirin EC 81 MG tablet Take 1 tablet (81 mg total) by mouth daily. 10/29/15  Yes Buriev, Isaiah Serge, MD  carvedilol (COREG) 6.25 MG tablet Take 2 tablets (12.5 mg total) by mouth 2 (two) times daily with a meal. 10/29/15  Yes Buriev, Isaiah Serge, MD  gabapentin (NEURONTIN) 100 MG capsule Take 1 capsule (100 mg total) by mouth 3 (three) times daily. 03/26/20  Yes Felecia Shelling, DPM  losartan (COZAAR) 25 MG tablet Take 12.5 mg by mouth daily.   Yes [provider]  meloxicam (MOBIC) 15 MG tablet Take 1 tablet (15 mg total) by mouth daily. 03/26/20  Yes Felecia Shelling, DPM  Multiple Vitamin (MULTIVITAMIN) tablet Take 1 tablet by mouth daily.   Yes [provider]  niacin (NIASPAN) 1000 MG CR tablet Take 1,000 mg by  mouth 2 (two) times daily.   Yes [provider]  Omega-3 Fatty Acids (FISH OIL) 1000 MG CAPS Take 1,000 mg by mouth 2 (two) times daily.   Yes [provider]  simvastatin (ZOCOR) 10 MG tablet Take by mouth daily. Unsure of mg   Yes [provider]  tamsulosin (FLOMAX) 0.4 MG CAPS capsule Take 0.4 mg by mouth.   Yes [provider]  tiZANidine (ZANAFLEX) 4 MG tablet Take 1 tablet (4 mg total) by mouth at bedtime. 02/09/21  Yes Bing Neighbors, FNP  albuterol (PROVENTIL HFA;VENTOLIN HFA) 108 (90 Base) MCG/ACT inhaler Inhale 2 puffs into the lungs every 6 (six) hours as needed for wheezing or shortness of breath. 10/29/15   Buriev, Isaiah Serge, MD  atorvastatin (LIPITOR) 20 MG tablet Take 1 tablet (20 mg total) by mouth daily. 10/29/15   Esperanza Sheets, MD  potassium citrate (UROCIT-K) 10 MEQ (1080 MG) SR tablet Take 10 mEq by mouth 2 (two) times daily.    [provider]  ranitidine (ZANTAC) 150 MG tablet Take 150 mg by mouth 2 (two) times daily.    [provider]  terazosin (HYTRIN) 5 MG capsule Take 5 mg by mouth at bedtime.    [provider]    Family History No family history  on file.  Social History Social History   Tobacco Use   Smoking status: Former    Pack years: 0.00   Smokeless tobacco: Never  Substance Use Topics   Alcohol use: Yes    Comment: occ   Drug use: No     Allergies   Lisinopril, Neosporin [neomycin-bacitracin zn-polymyx], and Plavix [clopidogrel bisulfate]   Review of Systems Review of Systems Pertinent negatives listed in HPI   Physical Exam Triage Vital Signs ED Triage Vitals [02/09/21 1454]  Enc Vitals Group     BP (!) 148/84     Pulse Rate 71     Resp 19     Temp 98.2 F (36.8 C)     Temp Source Oral     SpO2 92 %     Weight      Height      Head Circumference      Peak Flow      Pain Score 6     Pain Loc      Pain Edu?      Excl. in GC?    No data found.  Updated  Vital Signs BP (!) 148/84 (BP Location: Right Arm)   Pulse 71   Temp 98.2 F (36.8 C) (Oral)   Resp 19   SpO2 92%   Visual Acuity Right Eye Distance:   Left Eye Distance:   Bilateral Distance:    Right Eye Near:   Left Eye Near:    Bilateral Near:     Physical Exam General appearance: alert, well developed, well nourished, cooperative  Head: Normocephalic, without obvious abnormality, atraumatic Respiratory: Respirations even and unlabored, normal respiratory rate Heart: Rate and rhythm normal. No gallop or murmurs noted on exam  Extremities: Diffuse swelling involving the right fifth digit ecchymosis at the PIP and proximal region of the fifth digit.  Obvious deformity of the DIP.no gross deformities Skin: Skin color, texture, turgor normal. No rashes seen  Psych: Appropriate mood and affect. Neurologic: Mental status: Alert, oriented to person, place, and time, thought content appropriate.  UC Treatments / Results  Labs (all labs ordered are listed, but only abnormal results are displayed) Labs Reviewed - No data to display  EKG   Radiology DG Hand Complete Right  Result Date: 02/09/2021 CLINICAL DATA:  Patient tripped and fell this morning. Pain and swelling of the 5th finger. EXAM: RIGHT HAND - COMPLETE 3+ VIEW COMPARISON:  None. FINDINGS: There is a comminuted fracture of the distal aspect of the 5th proximal phalanx which involves the articular surface and is associated with significant soft tissue swelling. Incidental note is made of a 4 millimeter linear metallic foreign body along the volar aspect of the third distal phalanx. IMPRESSION: Comminuted fracture of the 5th proximal phalanx and associated soft tissue swelling. 4 millimeter metallic foreign body in the soft tissues of the distal aspect of the third digit. Electronically Signed   By: Norva Pavlov M.D.   On: 02/09/2021 15:12    Procedures Procedures (including critical care time)  Medications Ordered in  UC Medications - No data to display  Initial Impression / Assessment and Plan / UC Course  I have reviewed the triage vital signs and the nursing notes.  Pertinent labs & imaging results that were available during my care of the patient were reviewed by me and considered in my medical decision making (see chart for details).    Patient with a displaced fracture of the distal phalanx involving the left fifth  digit.  Finger splint.  Encouraged elevate and hand to reduce swelling. Patient advised to contact the hand center to schedule an appointment given the degree of his fracture he warrants further evaluation by specialist.. Continue Tylenol for pain for her moderate to severe pain tizanidine. Final Clinical Impressions(s) / UC Diagnoses   Final diagnoses:  Displaced fracture of distal phalanx of left little finger, initial encounter for closed fracture   Discharge Instructions   None    ED Prescriptions     Medication Sig Dispense Auth. Provider   tiZANidine (ZANAFLEX) 4 MG tablet Take 1 tablet (4 mg total) by mouth at bedtime. 20 tablet Bing Neighbors, FNP      PDMP not reviewed this encounter.   Bing Neighbors, FNP 02/10/21 1850

## 2021-02-09 NOTE — ED Triage Notes (Signed)
Pt tripped and fell this morning, now has pain and swelling to RT fifth finger.

## 2022-12-09 IMAGING — DX DG HAND COMPLETE 3+V*R*
3 series · 3 of 3 positions shown · non-contrast
Comparison: None.

CLINICAL DATA: Patient tripped and fell this morning. Pain and
swelling of the 5th finger.

EXAM:
RIGHT HAND - COMPLETE 3+ VIEW

[hand pa]
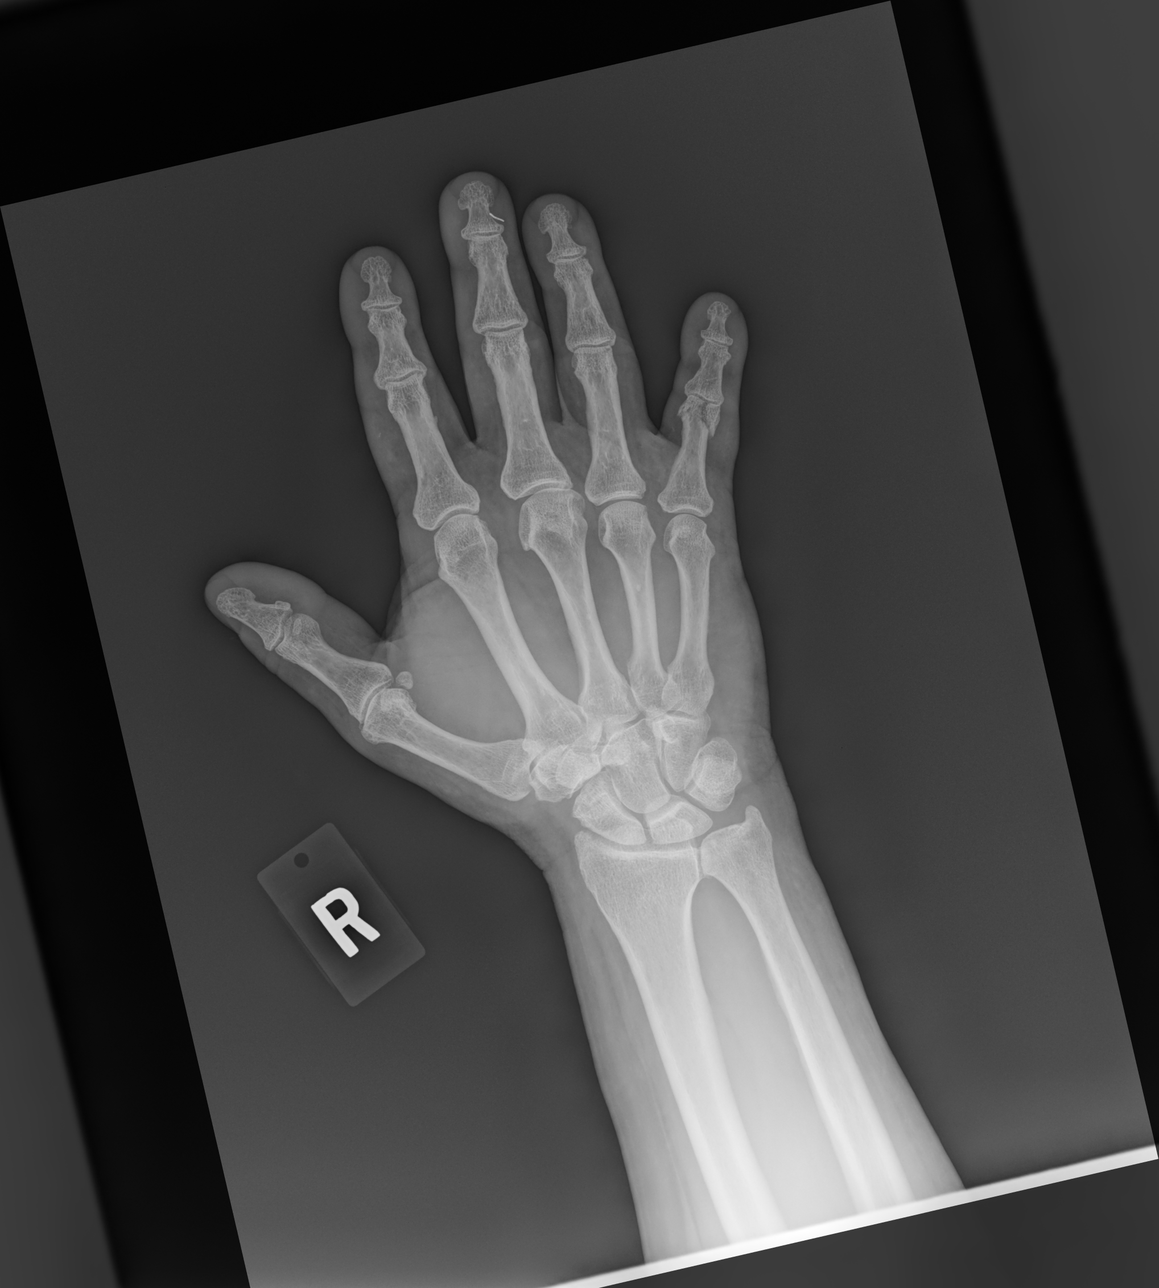

[hand mlo]
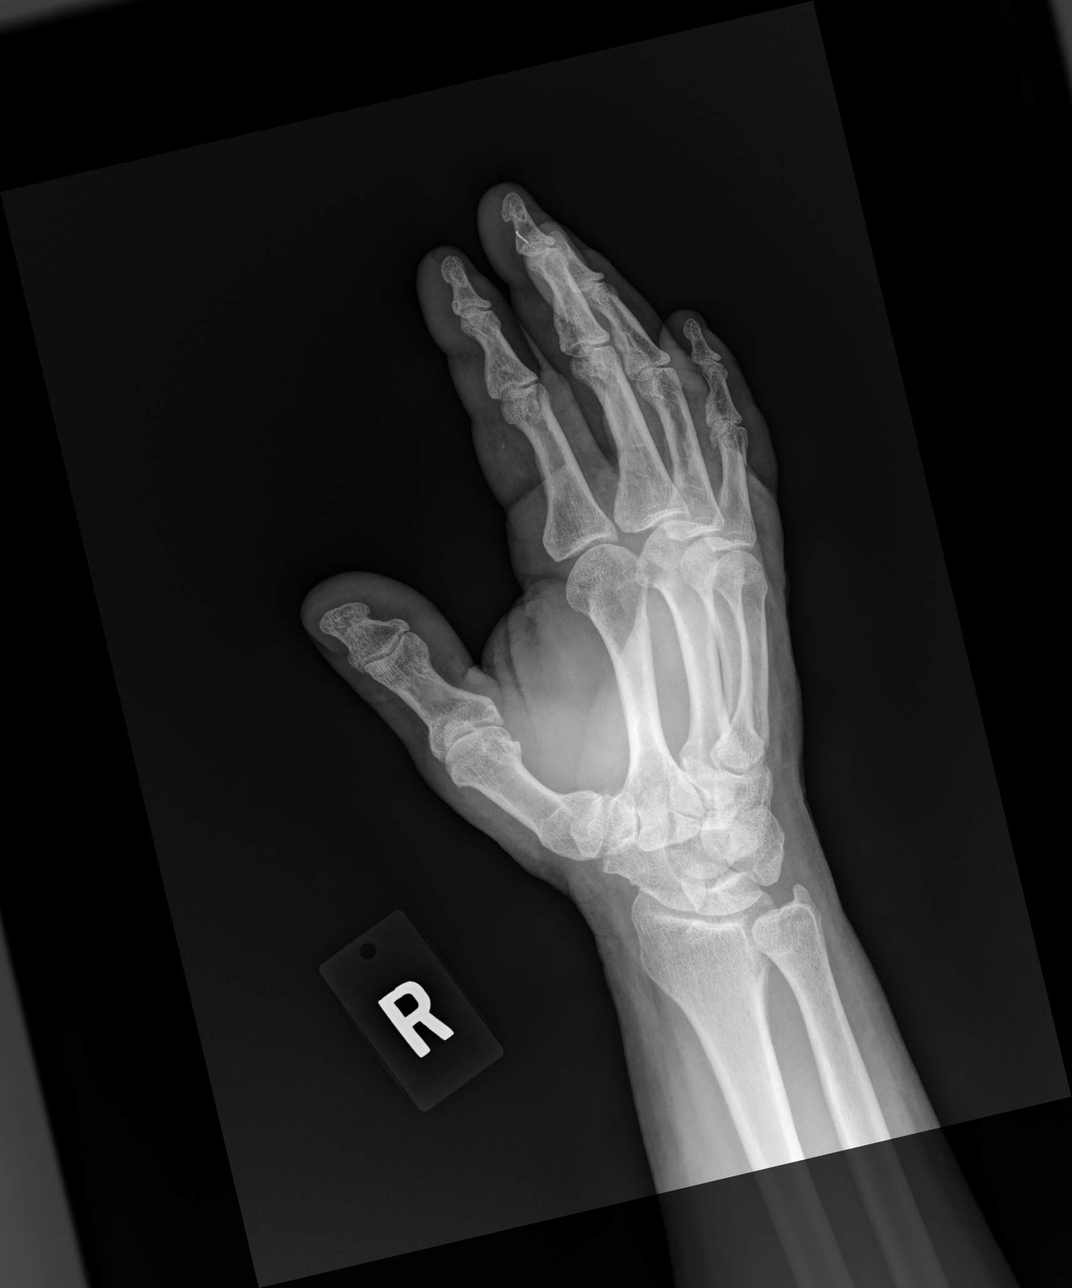

[hand lat]
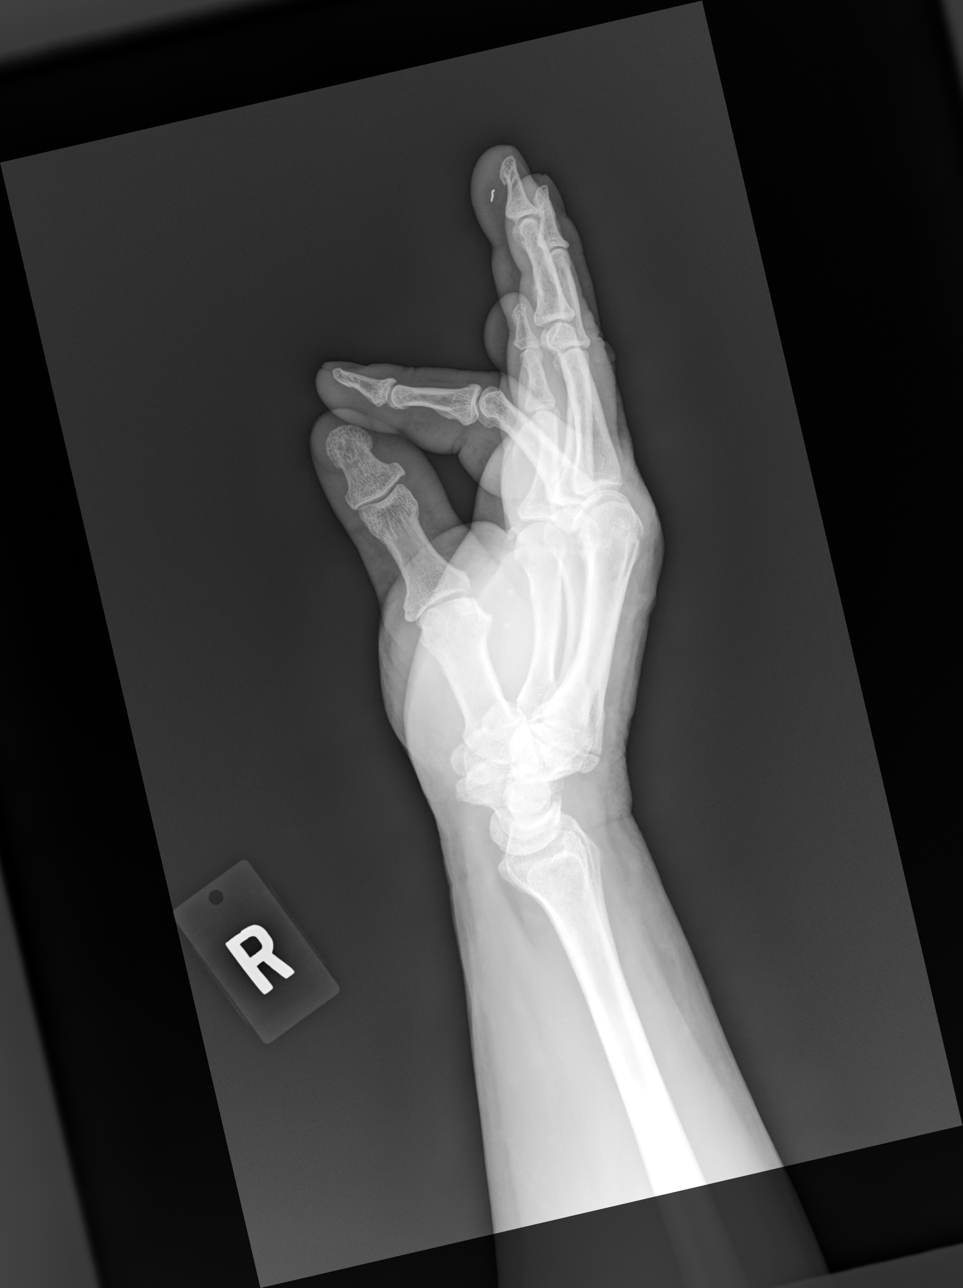

[3 of 3 positions shown; findings below may reference images not displayed]

FINDINGS: There is a comminuted fracture of the distal aspect of the 5th
proximal phalanx which involves the articular surface and is
associated with significant soft tissue swelling.

Incidental note is made of a 4 millimeter linear metallic foreign
body along the volar aspect of the third distal phalanx.
IMPRESSION: Comminuted fracture of the 5th proximal phalanx and associated soft
tissue swelling.

4 millimeter metallic foreign body in the soft tissues of the distal
aspect of the third digit.
